# Patient Record
Sex: Female | Born: 1965 | Race: White | Hispanic: No | Marital: Married | Smoking: Never smoker
Health system: Southern US, Community
[De-identification: ages and names within clinical notes are randomized; demographics above are authoritative.]

## PROBLEM LIST (undated history)

## (undated) DIAGNOSIS — F419 Anxiety disorder, unspecified: Secondary | ICD-10-CM

## (undated) DIAGNOSIS — R253 Fasciculation: Secondary | ICD-10-CM

## (undated) DIAGNOSIS — K219 Gastro-esophageal reflux disease without esophagitis: Secondary | ICD-10-CM

## (undated) DIAGNOSIS — R202 Paresthesia of skin: Secondary | ICD-10-CM

## (undated) DIAGNOSIS — I10 Essential (primary) hypertension: Secondary | ICD-10-CM

## (undated) DIAGNOSIS — F32A Depression, unspecified: Secondary | ICD-10-CM

## (undated) DIAGNOSIS — F329 Major depressive disorder, single episode, unspecified: Secondary | ICD-10-CM

## (undated) DIAGNOSIS — D649 Anemia, unspecified: Secondary | ICD-10-CM

## (undated) DIAGNOSIS — G5600 Carpal tunnel syndrome, unspecified upper limb: Secondary | ICD-10-CM

## (undated) DIAGNOSIS — G510 Bell's palsy: Secondary | ICD-10-CM

## (undated) DIAGNOSIS — R790 Abnormal level of blood mineral: Secondary | ICD-10-CM

## (undated) DIAGNOSIS — R42 Dizziness and giddiness: Secondary | ICD-10-CM

## (undated) DIAGNOSIS — R0789 Other chest pain: Secondary | ICD-10-CM

## (undated) DIAGNOSIS — R002 Palpitations: Secondary | ICD-10-CM

## (undated) HISTORY — PX: EYE SURGERY: SHX253

## (undated) HISTORY — DX: Other chest pain: R07.89

## (undated) HISTORY — DX: Essential (primary) hypertension: I10

## (undated) HISTORY — DX: Fasciculation: R25.3

## (undated) HISTORY — DX: Palpitations: R00.2

## (undated) HISTORY — PX: EXTERNAL EAR SURGERY: SHX627

## (undated) HISTORY — DX: Dizziness and giddiness: R42

## (undated) HISTORY — PX: REFRACTIVE SURGERY: SHX103

## (undated) HISTORY — DX: Bell's palsy: G51.0

## (undated) HISTORY — DX: Paresthesia of skin: R20.2

---

## 1988-07-24 HISTORY — PX: WISDOM TOOTH EXTRACTION: SHX21

## 1998-07-24 DIAGNOSIS — G510 Bell's palsy: Secondary | ICD-10-CM

## 1998-07-24 HISTORY — DX: Bell's palsy: G51.0

## 1999-10-09 ENCOUNTER — Inpatient Hospital Stay (HOSPITAL_COMMUNITY): Admission: AD | Admit: 1999-10-09 | Discharge: 1999-10-12 | Payer: Self-pay | Admitting: Gynecology

## 1999-11-21 ENCOUNTER — Other Ambulatory Visit: Admission: RE | Admit: 1999-11-21 | Discharge: 1999-11-21 | Payer: Self-pay | Admitting: Gynecology

## 2001-04-04 ENCOUNTER — Other Ambulatory Visit: Admission: RE | Admit: 2001-04-04 | Discharge: 2001-04-04 | Payer: Self-pay | Admitting: Gynecology

## 2002-03-19 ENCOUNTER — Other Ambulatory Visit: Admission: RE | Admit: 2002-03-19 | Discharge: 2002-03-19 | Payer: Self-pay | Admitting: Gynecology

## 2002-04-08 ENCOUNTER — Encounter: Admission: RE | Admit: 2002-04-08 | Discharge: 2002-04-08 | Payer: Self-pay | Admitting: Gynecology

## 2002-04-08 ENCOUNTER — Encounter: Payer: Self-pay | Admitting: Gynecology

## 2004-01-12 ENCOUNTER — Other Ambulatory Visit: Admission: RE | Admit: 2004-01-12 | Discharge: 2004-01-12 | Payer: Self-pay | Admitting: Gynecology

## 2005-10-30 ENCOUNTER — Other Ambulatory Visit: Admission: RE | Admit: 2005-10-30 | Discharge: 2005-10-30 | Payer: Self-pay | Admitting: Gynecology

## 2006-03-24 HISTORY — PX: NOVASURE ABLATION: SHX5394

## 2006-04-19 ENCOUNTER — Encounter (INDEPENDENT_AMBULATORY_CARE_PROVIDER_SITE_OTHER): Payer: Self-pay | Admitting: Specialist

## 2006-04-19 ENCOUNTER — Ambulatory Visit (HOSPITAL_COMMUNITY): Admission: RE | Admit: 2006-04-19 | Discharge: 2006-04-19 | Payer: Self-pay | Admitting: Gynecology

## 2007-04-05 ENCOUNTER — Other Ambulatory Visit: Admission: RE | Admit: 2007-04-05 | Discharge: 2007-04-05 | Payer: Self-pay | Admitting: Gynecology

## 2007-04-05 ENCOUNTER — Encounter: Admission: RE | Admit: 2007-04-05 | Discharge: 2007-04-05 | Payer: Self-pay | Admitting: Gynecology

## 2008-04-10 ENCOUNTER — Encounter: Admission: RE | Admit: 2008-04-10 | Discharge: 2008-04-10 | Payer: Self-pay | Admitting: Gynecology

## 2008-04-17 ENCOUNTER — Ambulatory Visit: Payer: Self-pay | Admitting: Gynecology

## 2008-04-17 ENCOUNTER — Encounter: Payer: Self-pay | Admitting: Gynecology

## 2008-04-17 ENCOUNTER — Other Ambulatory Visit: Admission: RE | Admit: 2008-04-17 | Discharge: 2008-04-17 | Payer: Self-pay | Admitting: Gynecology

## 2008-09-16 ENCOUNTER — Ambulatory Visit: Payer: Self-pay | Admitting: Gynecology

## 2008-10-27 ENCOUNTER — Ambulatory Visit: Payer: Self-pay | Admitting: Gynecology

## 2009-05-14 ENCOUNTER — Encounter: Admission: RE | Admit: 2009-05-14 | Discharge: 2009-05-14 | Payer: Self-pay | Admitting: Gynecology

## 2009-05-20 ENCOUNTER — Encounter: Admission: RE | Admit: 2009-05-20 | Discharge: 2009-05-20 | Payer: Self-pay | Admitting: Gynecology

## 2009-07-20 ENCOUNTER — Other Ambulatory Visit: Admission: RE | Admit: 2009-07-20 | Discharge: 2009-07-20 | Payer: Self-pay | Admitting: Gynecology

## 2009-07-20 ENCOUNTER — Ambulatory Visit: Payer: Self-pay | Admitting: Gynecology

## 2009-07-24 HISTORY — PX: GUM SURGERY: SHX658

## 2010-07-20 ENCOUNTER — Encounter
Admission: RE | Admit: 2010-07-20 | Discharge: 2010-07-20 | Payer: Self-pay | Source: Home / Self Care | Attending: Gynecology | Admitting: Gynecology

## 2010-07-20 IMAGING — MG MM DIGITAL SCREENING
5 series · 5 of 5 positions shown · non-contrast
Comparison: none

DG SCREEN MAMMOGRAM BILATERAL
Bilateral CC and MLO view(s) were taken.

DIGITAL SCREENING MAMMOGRAM WITH CAD:
There are scattered fibroglandular densities.  No masses or malignant type calcifications are 
identified.  Compared with prior studies.
Images were processed with CAD.

[R CC (1 of 2)]
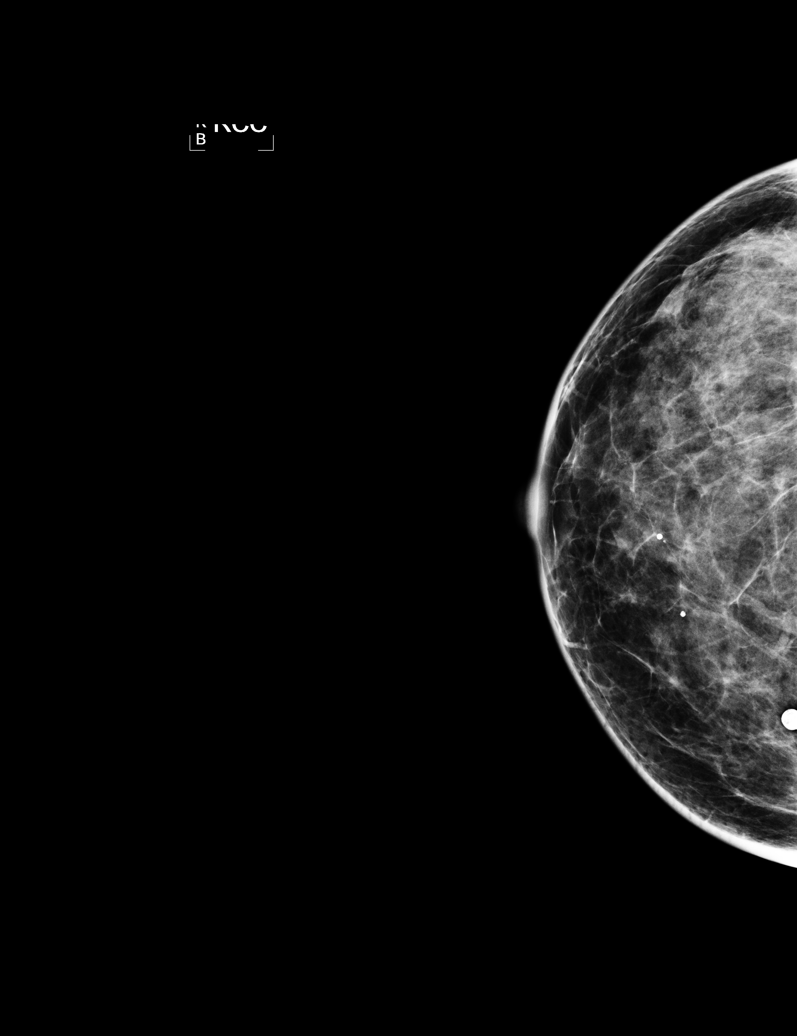

[L CC]
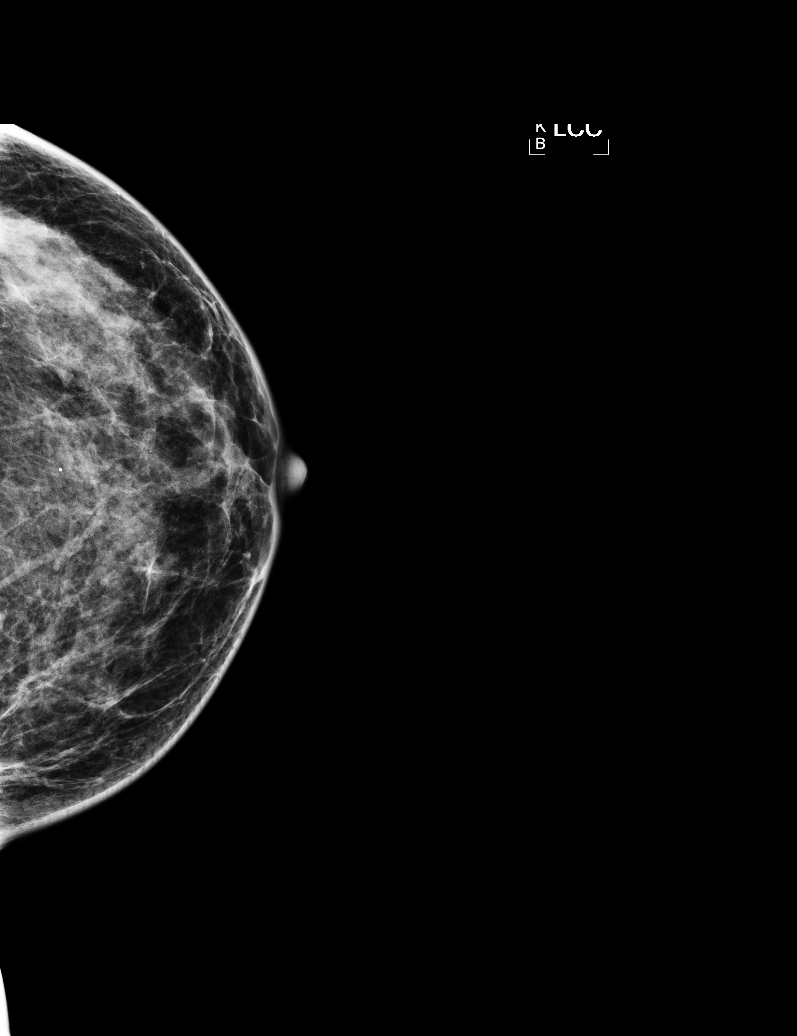

[L MLO]
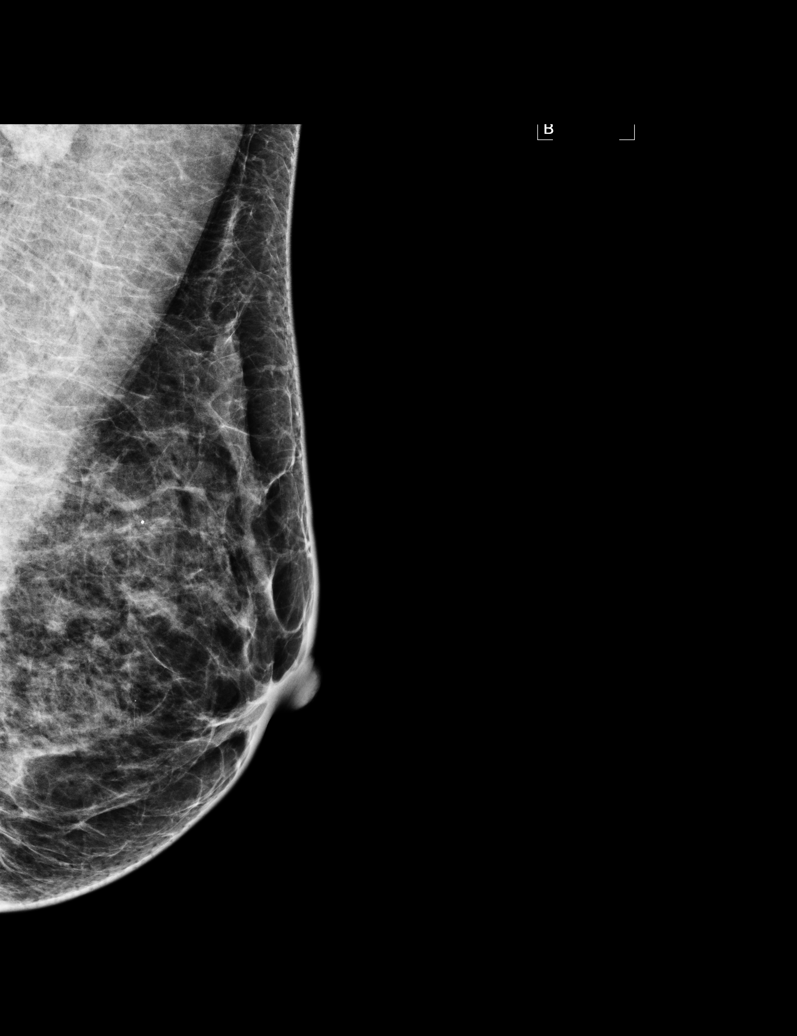

[R MLO]
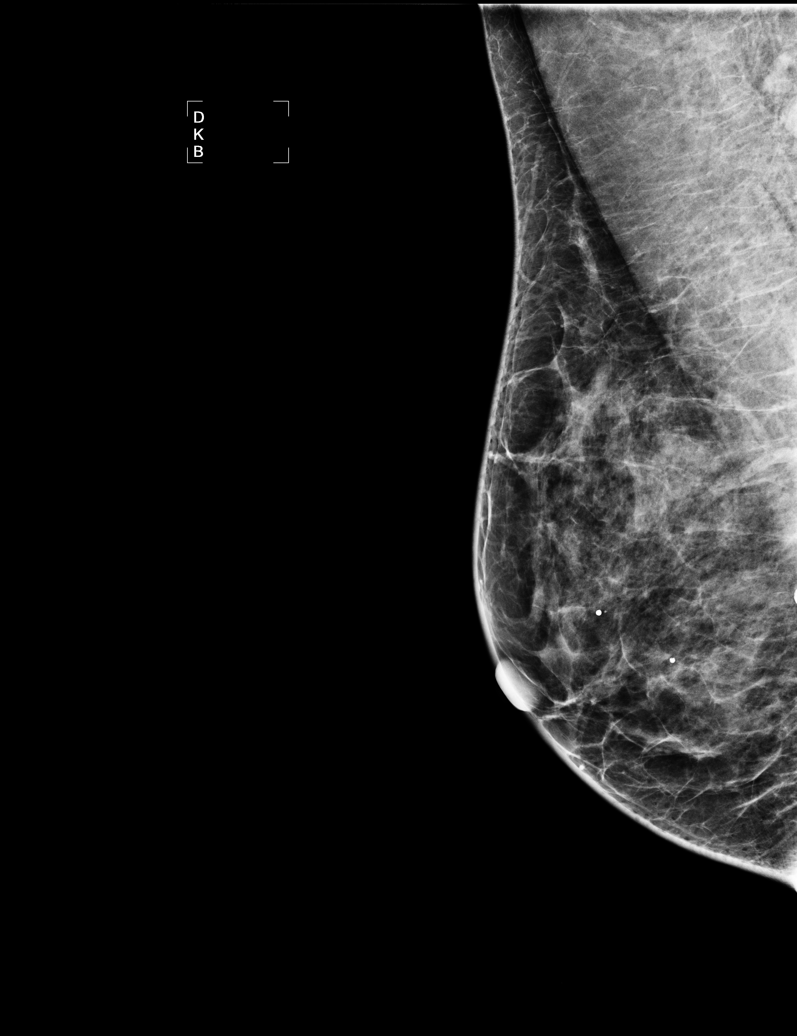

[R CC (2 of 2)]
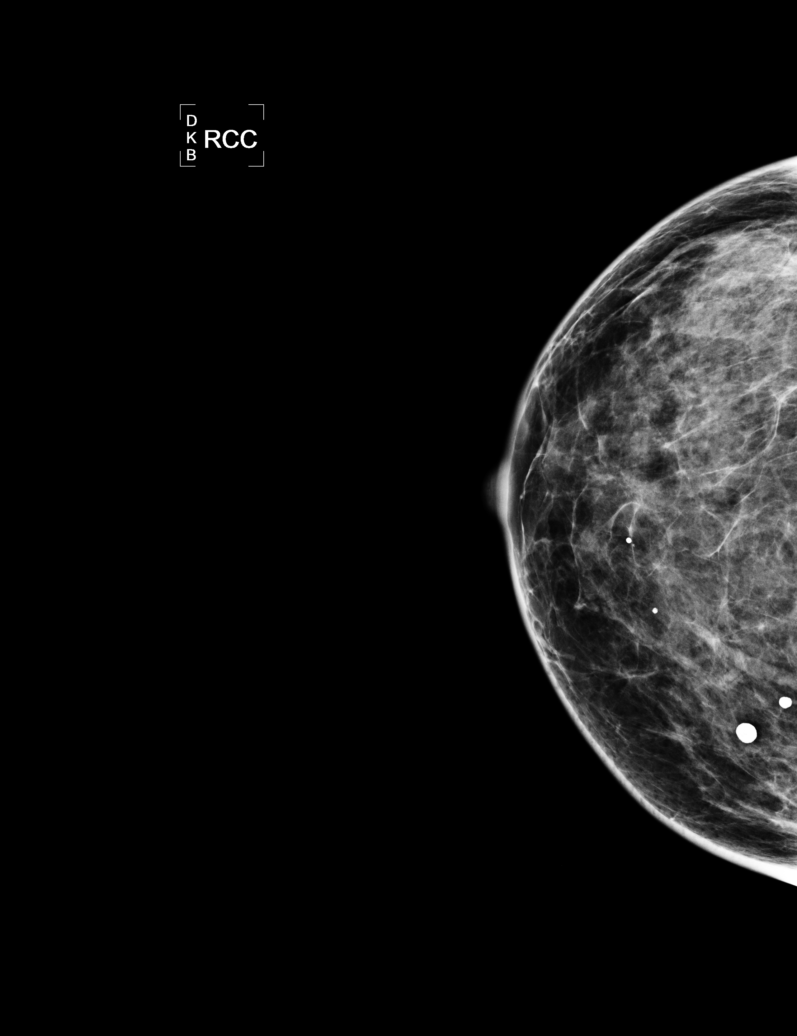

[5 of 5 positions shown; findings below may reference images not displayed]

IMPRESSION: No specific mammographic evidence of malignancy.  Next screening mammogram is recommended in one 
year.

A result letter of this screening mammogram will be mailed directly to the patient.

ASSESSMENT: Negative - BI-RADS 1

Screening mammogram in 1 year.
,

## 2010-07-24 HISTORY — PX: FOOT SURGERY: SHX648

## 2010-08-14 ENCOUNTER — Encounter: Payer: Self-pay | Admitting: Gynecology

## 2010-12-09 NOTE — H&P (Signed)
NAMEMINNA, DUMIRE                ACCOUNT NO.:  [id number]   MEDICAL RECORD NO.:  [id number]          PATIENT TYPE:  AMB   LOCATION:  SDC                           FACILITY:  [facility]   PHYSICIAN:  Timothy P. Fontaine, M.D.DATE OF BIRTH:  02/19/1966   DATE OF ADMISSION:  DATE OF DISCHARGE:                                HISTORY & PHYSICAL   CHIEF COMPLAINT:  Menorrhagia.   HISTORY OF PRESENT ILLNESS:  A 45 year old G2, P2 female vasectomy birth  control who presents complaining of increasing menorrhagia.  Her outpatient  evaluation includes a normal thyroid hormone, hemoglobin of 14 and a  sonohistogram which shows an anterior myoma that displaces the endometrial  cavity but is within the wall of the uterus.  There is no intracavitary  abnormalities.  The patient is admitted at this time for hysteroscopy, D&C,  NovaSure, endometrial ablation.  Past medical history is uncomplicated.  Past surgical history is uncomplicated.   CURRENT MEDICATIONS:  Multivitamins.   ALLERGIES:  No medications.   REVIEW OF SYSTEMS:  Noncontributory.   FAMILY HISTORY:  Noncontributory.   SOCIAL HISTORY:  Noncontributory.   ADMISSION PHYSICAL EXAM:  Afebrile.  Vital signs are stable.  HEENT:  Normal.  LUNGS:  Clear.  CARDIAC:  Regular rate.  No rubs, murmurs or gallops.  ABDOMINAL EXAM:  Benign.  PELVIC:  External BUS and vagina normal.  Cervix normal.  Uterus grossly  normal in size, midline mobile, nontender.  Adnexa without masses or  tenderness.   ASSESSMENT:  A 45 year old gravida 2, para 2 female vasectomy birth control  increasing menorrhagia becoming socially unacceptable for endometrial  ablation.  She does have small myomas.  I reviewed with her that she does  have some distortion of the cavity from the myoma, although it is minimal  and most of the myomas are intramural.  I reviewed the long-term/short-term  issues with ablation.  She understands that she should never achieve  pregnancy  following this procedure.  She is using vasectomy birth control  but if other situations present then she knows she needs to use absolute  contraception.  She also understands there is no guarantees as far as  bleeding relief that her menorrhagia may continue or her bleeding may change  and she understands that there is no guarantee that she will stop having  periods.  The acute intraoperative/postoperative risks were discussed.  I  reviewed the NovaSure technique and technology and the risks of bleeding  necessitating transfusion and the risks of transfusion were reviewed.  The  risks of infection, both acutely or the weeks following procedure, were  reviewed and the potential for prolonged antibiotics discussed.  The risk of  inadvertent injury to internal organs including bowel, bladder, ureters,  vessels and nerves either directly through uterine perforation or indirectly  through transuterine thermal damage necessitating major exploratory  reparative surgeries and future reparative surgeries including ostomy  formation was all reviewed, understood and accepted.  The patient  understands that it is machine dependent that if there is a malfunction in  the machine or her cavity is such  that will not allow proper placement of  the instrument that we may not be able to do the ablation at that time and  she understands and accepts this.  The patient's questions were answered to  her satisfaction.  She is ready to proceed with surgery.      Timothy P. Fontaine, M.D.  Electronically Signed     TPF/MEDQ  D:  04/12/2006  T:  04/14/2006  Job:  [id number]

## 2010-12-09 NOTE — Op Note (Signed)
NAMECHRISTEENA, Suzanne Marks                ACCOUNT NO.:  [id number]   MEDICAL RECORD NO.:  [id number]          PATIENT TYPE:  AMB   LOCATION:  SDC                           [facility]ILITY:  [facility]   PHYSICIAN:  Timothy P. Fontaine, M.D.DATE OF BIRTH:  1966-06-18   DATE OF PROCEDURE:  04/19/2006  DATE OF DISCHARGE:                                 OPERATIVE REPORT   PREOPERATIVE DIAGNOSES:  1. Menorrhagia.  2. Leiomyomata.   POSTOPERATIVE DIAGNOSES:  1. Menorrhagia.  2. Leiomyomata.   PROCEDURE:  NovaSure endometrial ablation, hysteroscopy, D&C.   SURGEON:  Timothy P. Fontaine, M.D.   ANESTHETIC:  General with 1% lidocaine paracervical block.   ESTIMATED BLOOD LOSS:  Minimal.  Sorbitol discrepancy minimal.   COMPLICATIONS:  None.   SPECIMEN:  Endometrial curetting.   FINDINGS:  EUA, external, BUS, vagina normal.  Cervix normal.  Bimanual  uterus globoid retroverted, upper limits of normal in size.  Adnexa without  masses.  Hysteroscopic, anterior submucous myoma, approximately half within  the cavity, otherwise hysteroscopy was normal and adequate.   PROCEDURE:  The patient was taken to the operating room, underwent general  anesthesia, was placed in low dorsal lithotomy position, received  perineal/vaginal preparation with Betadine scrub and solution.  Bladder  emptied with an in-and-out Foley catheterization and EUA performed.  The  patient was draped in the usual fashion.  Cervix was gently dilated, and the  cervical and the endometrial cavity lengths were determined.  Hysteroscopy  was performed.  Findings noted above.  Subsequently, the NovaSure  endometrial ablator was placed within the uterine cavity.  The instrument  deployed and manipulated to assure proper placement, and the cavity width  was determined.  The carbon dioxide test was then performed and passed.  Subsequently, the ablation was performed without difficulty.  Rehysteroscopy  showed good uterine distension, no evidence  of perforation, and uniform  treatment with the exception on the superior/posterior aspect of the myoma,  and fundus was untreated due to the myoma obscuring this area.  The  instruments were all removed.  Adequate hemostasis was visualized.  The  patient placed in supine  position, awakened without difficulty, and taken to recovery room in good  condition, having tolerated procedure well.  NovaSure measurements:  Sounding length 11 cm, cervical length 4 cm, cavity length 7 cm, cavity  width 3.3, power 118, time 54 seconds.      Timothy P. Fontaine, M.D.  Electronically Signed     TPF/MEDQ  D:  04/19/2006  T:  04/20/2006  Job:  [id number]

## 2010-12-09 NOTE — Discharge Summary (Signed)
[facility]  Patient:    Suzanne Marks, Suzanne Marks                       MRN: [id number] Adm. Date:  [id number] Disc. Date: [id number] Attending:  Douglass Rivers Dictator:   Antony Contras, R.N. [facility]                           Discharge Summary  DISCHARGE DIAGNOSIS:          Intrauterine pregnancy at term, with delivery of viable infant.  PROCEDURES:                   Normal spontaneous vaginal delivery over an intact perineum with repair of a superficial second degree laceration.  HISTORY OF PRESENT ILLNESS:   The patient is a 45 year old, gravida 2, para 1, 1-0-0-1, with an EDC of October 06, 1999.  Prenatal course uncomplicated.  The patient is Rh negative.  Prenatal labs are as follows:  Blood type O negative, antibody screen negative, RPR, HBsAg nonreactive.  Rubella titer positive.  GBS was negative.  HOSPITAL COURSE AND TREATMENT: The patient was admitted with questionable rupture membranes on October 10, 1999. At this time she was found to be 6 cm, 90%, and a -2 station.  She did progress to  complete dilatation and was delivered by Dr. Douglass Rivers of a viable female infant, Apgars 8/9, birth weight of 7 pounds 8 ounces, over an intact perineum with repair of a superficial second degree laceration.  Postpartum course was uncomplicated. She remained afebrile, was voiding well.  Postoperative CBC:  Hematocrit 36, hemoglobin 12.7, WBC 13.3, platelets 175.  She was able to be discharged on her  second postpartum day in satisfactory condition.  The baby was Rh negative, so he did not require RhoGAM.  DISPOSITION:                  Discharged to be followed up at [facility] in six weeks.  DISCHARGE MEDICATIONS:        Motrin and Tylox for pain.  Continue with prenatal vitamins. DD:  10/28/99 TD:  10/31/99 Job: [id number]

## 2011-01-17 ENCOUNTER — Other Ambulatory Visit: Payer: Self-pay | Admitting: Gynecology

## 2011-01-17 ENCOUNTER — Encounter (INDEPENDENT_AMBULATORY_CARE_PROVIDER_SITE_OTHER): Payer: BC Managed Care – PPO | Admitting: Gynecology

## 2011-01-17 ENCOUNTER — Other Ambulatory Visit (HOSPITAL_COMMUNITY)
Admission: RE | Admit: 2011-01-17 | Discharge: 2011-01-17 | Disposition: A | Discharge status: Home / Self Care | Payer: BC Managed Care – PPO | Source: Ambulatory Visit | Attending: Gynecology | Admitting: Gynecology

## 2011-01-17 DIAGNOSIS — Z01419 Encounter for gynecological examination (general) (routine) without abnormal findings: Secondary | ICD-10-CM

## 2011-01-17 DIAGNOSIS — B3731 Acute candidiasis of vulva and vagina: Secondary | ICD-10-CM

## 2011-01-17 DIAGNOSIS — Z13220 Encounter for screening for lipoid disorders: Secondary | ICD-10-CM

## 2011-01-17 DIAGNOSIS — Z833 Family history of diabetes mellitus: Secondary | ICD-10-CM

## 2011-01-17 DIAGNOSIS — Z124 Encounter for screening for malignant neoplasm of cervix: Secondary | ICD-10-CM | POA: Insufficient documentation

## 2011-01-17 DIAGNOSIS — B373 Candidiasis of vulva and vagina: Secondary | ICD-10-CM

## 2011-05-25 HISTORY — PX: REFRACTIVE SURGERY: SHX103

## 2011-06-21 ENCOUNTER — Other Ambulatory Visit: Payer: Self-pay | Admitting: Gynecology

## 2011-06-21 DIAGNOSIS — Z1231 Encounter for screening mammogram for malignant neoplasm of breast: Secondary | ICD-10-CM

## 2011-07-28 ENCOUNTER — Ambulatory Visit
Admission: RE | Admit: 2011-07-28 | Discharge: 2011-07-28 | Disposition: A | Discharge status: Home / Self Care | Payer: BC Managed Care – PPO | Source: Ambulatory Visit | Attending: Gynecology | Admitting: Gynecology

## 2011-07-28 DIAGNOSIS — Z1231 Encounter for screening mammogram for malignant neoplasm of breast: Secondary | ICD-10-CM

## 2012-01-23 ENCOUNTER — Ambulatory Visit (INDEPENDENT_AMBULATORY_CARE_PROVIDER_SITE_OTHER): Payer: BC Managed Care – PPO | Admitting: Gynecology

## 2012-01-23 ENCOUNTER — Encounter: Payer: Self-pay | Admitting: Gynecology

## 2012-01-23 VITALS — BP 112/70 | Ht 65.5 in | Wt 127.0 lb

## 2012-01-23 DIAGNOSIS — L659 Nonscarring hair loss, unspecified: Secondary | ICD-10-CM

## 2012-01-23 DIAGNOSIS — Z01419 Encounter for gynecological examination (general) (routine) without abnormal findings: Secondary | ICD-10-CM

## 2012-01-23 DIAGNOSIS — Z13220 Encounter for screening for lipoid disorders: Secondary | ICD-10-CM

## 2012-01-23 DIAGNOSIS — Z131 Encounter for screening for diabetes mellitus: Secondary | ICD-10-CM

## 2012-01-23 NOTE — Progress Notes (Signed)
Suzanne Marks [birth year] [id number]        46 y.o. G2 P2 for annual exam.  Doing well no complaints.  Past medical history,surgical history, medications, allergies, family history and social history were all reviewed and documented in the EPIC chart. ROS:  Was performed and pertinent positives and negatives are included in the history.  Exam: Amy assistant Filed Vitals:   01/23/12 1046  BP: 112/70   General appearance  Normal Skin grossly normal Head/Neck normal with no cervical or supraclavicular adenopathy thyroid normal Lungs  clear Cardiac RR, without RMG Abdominal  soft, nontender, without masses, organomegaly or hernia Breasts  examined lying and sitting without masses, retractions, discharge or axillary adenopathy. Pelvic  Ext/BUS/vagina  normal   Cervix  normal   Uterus  retroverted, normal size, shape and contour, midline and mobile nontender   Adnexa  Without masses or tenderness    Anus and perineum  normal   Rectovaginal  normal sphincter tone without palpated masses or tenderness.    Assessment/Plan:  46 y.o. G2 P2 female for annual exam, regular menses, vasectomy birth control.    1. Mammography. Patient had mammogram January 2013. We'll continue with annual mammography. SBE monthly reviewed. 2. NovaSure ablation. Menses are light, acceptable. We'll continue to monitor. 3. Pap smear. Last Pap smear 2012. No Pap smear done today. No history of abnormal Pap smears with numerous normal reports in her chart. Per current screening guidelines we'll plan every 3-5 your screening. 4. Alopecia. Patient has noticed generalized hair loss. Saw dermatologist who recommended iron, ferritin and TSH levels. These were ordered for her we'll forward to her dermatologist. 5. Health maintenance. Baseline CBC lipid profile glucose urinalysis ordered along with the above blood work. Assuming she continues well from a gynecologic standpoint and she will see me in a year, sooner as  needed.    Dara Lords MD, 11:04 AM 01/23/2012

## 2012-01-23 NOTE — Patient Instructions (Signed)
Office will call you with lab results. Follow up in one year for annual gynecologic exam. 

## 2012-01-24 ENCOUNTER — Other Ambulatory Visit: Payer: Self-pay | Admitting: *Deleted

## 2012-01-24 DIAGNOSIS — Z01419 Encounter for gynecological examination (general) (routine) without abnormal findings: Secondary | ICD-10-CM

## 2012-01-24 DIAGNOSIS — L659 Nonscarring hair loss, unspecified: Secondary | ICD-10-CM

## 2012-01-24 DIAGNOSIS — Z131 Encounter for screening for diabetes mellitus: Secondary | ICD-10-CM

## 2012-01-24 DIAGNOSIS — Z13220 Encounter for screening for lipoid disorders: Secondary | ICD-10-CM

## 2012-01-24 LAB — URINALYSIS W MICROSCOPIC + REFLEX CULTURE
Bacteria, UA: NONE SEEN
Casts: NONE SEEN
Glucose, UA: NEGATIVE mg/dL
Hgb urine dipstick: NEGATIVE
Ketones, ur: 15 mg/dL — AB
Leukocytes, UA: NEGATIVE
Protein, ur: NEGATIVE mg/dL
pH: 5.5 (ref 5.0–8.0)

## 2012-01-24 NOTE — Addendum Note (Signed)
Addended byValeda Malm L on: 01/24/2012 02:25 PM   Modules accepted: Orders

## 2012-01-30 ENCOUNTER — Other Ambulatory Visit: Payer: BC Managed Care – PPO

## 2012-01-30 DIAGNOSIS — Z131 Encounter for screening for diabetes mellitus: Secondary | ICD-10-CM

## 2012-01-30 DIAGNOSIS — Z13220 Encounter for screening for lipoid disorders: Secondary | ICD-10-CM

## 2012-01-30 DIAGNOSIS — L659 Nonscarring hair loss, unspecified: Secondary | ICD-10-CM

## 2012-01-30 DIAGNOSIS — Z01419 Encounter for gynecological examination (general) (routine) without abnormal findings: Secondary | ICD-10-CM

## 2012-01-30 LAB — CBC WITH DIFFERENTIAL/PLATELET
Basophils Absolute: 0 10*3/uL (ref 0.0–0.1)
Basophils Relative: 0 % (ref 0–1)
Eosinophils Absolute: 0.2 10*3/uL (ref 0.0–0.7)
Eosinophils Relative: 3 % (ref 0–5)
HCT: 39.1 % (ref 36.0–46.0)
Hemoglobin: 13.5 g/dL (ref 12.0–15.0)
Lymphocytes Relative: 29 % (ref 12–46)
Lymphs Abs: 1.6 10*3/uL (ref 0.7–4.0)
MCH: 31.9 pg (ref 26.0–34.0)
MCHC: 34.5 g/dL (ref 30.0–36.0)
MCV: 92.4 fL (ref 78.0–100.0)
Monocytes Absolute: 0.5 10*3/uL (ref 0.1–1.0)
Monocytes Relative: 9 % (ref 3–12)
Neutro Abs: 3.1 10*3/uL (ref 1.7–7.7)
Neutrophils Relative %: 59 % (ref 43–77)
Platelets: 283 10*3/uL (ref 150–400)
RBC: 4.23 MIL/uL (ref 3.87–5.11)
RDW: 13 % (ref 11.5–15.5)
WBC: 5.3 10*3/uL (ref 4.0–10.5)

## 2012-01-31 LAB — TSH: TSH: 2.598 u[IU]/mL (ref 0.350–4.500)

## 2012-01-31 LAB — FERRITIN: Ferritin: 15 ng/mL (ref 10–291)

## 2012-01-31 LAB — LIPID PANEL
Cholesterol: 179 mg/dL (ref 0–200)
HDL: 69 mg/dL (ref >39)
LDL Cholesterol: 97 mg/dL (ref 0–99)
Triglycerides: 63 mg/dL (ref <150)

## 2012-06-28 ENCOUNTER — Ambulatory Visit (INDEPENDENT_AMBULATORY_CARE_PROVIDER_SITE_OTHER): Payer: BC Managed Care – PPO | Admitting: Gynecology

## 2012-06-28 ENCOUNTER — Encounter: Payer: Self-pay | Admitting: Gynecology

## 2012-06-28 DIAGNOSIS — N9489 Other specified conditions associated with female genital organs and menstrual cycle: Secondary | ICD-10-CM

## 2012-06-28 DIAGNOSIS — B373 Candidiasis of vulva and vagina: Secondary | ICD-10-CM

## 2012-06-28 DIAGNOSIS — L659 Nonscarring hair loss, unspecified: Secondary | ICD-10-CM

## 2012-06-28 DIAGNOSIS — K644 Residual hemorrhoidal skin tags: Secondary | ICD-10-CM

## 2012-06-28 DIAGNOSIS — N949 Unspecified condition associated with female genital organs and menstrual cycle: Secondary | ICD-10-CM

## 2012-06-28 LAB — WET PREP FOR TRICH, YEAST, CLUE: Trich, Wet Prep: NONE SEEN

## 2012-06-28 MED ORDER — FLUCONAZOLE 200 MG PO TABS: 200.0000 mg | ORAL_TABLET | Freq: Every day | ORAL | Status: DC

## 2012-06-28 NOTE — Progress Notes (Signed)
Patient presents with several days of vaginal itching as well as some itching around her rectum. Asked if she could have some blood drawn in reference to her alopecia as she is seeing a dermatologist and they had recommended a ferritin level.  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 Cervix normal. Uterus retroverted. Adnexa without masses or tenderness. Perineum/anus with small hemorrhoidal skin tag at 12:00 not inflamed. No evidence of fissures  Assessment and plan: 1. Vaginal itching. Wet prep is positive for yeast. Given the perirectal involvement Will treat with Diflucan 200 daily x3 days. I gave her 2 extra pills to have available for when necessary use. Follow up if symptoms persist or recur. 2. Generalized alopecia. We've discussed this previously I ordered a ferritin level testosterone and TSH. She'll have these to provide to her dermatologist. 3. Hemorrhoidal skin tag. Discussed with patient and reassured her that this is not unusual and probably a remnant from a pregnancy hemorrhoid.

## 2012-06-28 NOTE — Patient Instructions (Signed)
Take Diflucan pill one daily for 3 days. Save the other 2 pills to use as needed for itching.

## 2012-07-02 ENCOUNTER — Other Ambulatory Visit: Payer: Self-pay | Admitting: Gynecology

## 2012-07-02 DIAGNOSIS — Z1231 Encounter for screening mammogram for malignant neoplasm of breast: Secondary | ICD-10-CM

## 2012-07-08 ENCOUNTER — Telehealth: Payer: Self-pay | Admitting: Gynecology

## 2012-07-08 MED ORDER — TERCONAZOLE 0.8 % VA CREA: 1.0000 | TOPICAL_CREAM | Freq: Every day | VAGINAL | Status: DC

## 2012-07-08 NOTE — Telephone Encounter (Signed)
Was in Dec 6 and she said you diagnosed yeast infection. She has taken the 3 pills and still pretty uncomfortable. She said you wrote for two extra pills but since they haven't really helped much she didn't know if she should fill those and take or if you might want to prescribe her different Rx.

## 2012-07-08 NOTE — Telephone Encounter (Signed)
Patient informed. 

## 2012-07-08 NOTE — Telephone Encounter (Signed)
Recommend Terazol 3 day cream. It may be resistant to Diflucan and Terazol should take care of it.

## 2012-07-17 ENCOUNTER — Ambulatory Visit: Payer: BC Managed Care – PPO

## 2012-07-17 ENCOUNTER — Ambulatory Visit (INDEPENDENT_AMBULATORY_CARE_PROVIDER_SITE_OTHER): Payer: BC Managed Care – PPO | Admitting: Family Medicine

## 2012-07-17 VITALS — BP 161/100 | HR 80 | Temp 98.7°F | Resp 16 | Ht 65.75 in | Wt 131.2 lb

## 2012-07-17 DIAGNOSIS — R0781 Pleurodynia: Secondary | ICD-10-CM

## 2012-07-17 DIAGNOSIS — R079 Chest pain, unspecified: Secondary | ICD-10-CM

## 2012-07-17 DIAGNOSIS — R000 Tachycardia, unspecified: Secondary | ICD-10-CM

## 2012-07-17 DIAGNOSIS — F439 Reaction to severe stress, unspecified: Secondary | ICD-10-CM

## 2012-07-17 DIAGNOSIS — R0789 Other chest pain: Secondary | ICD-10-CM

## 2012-07-17 DIAGNOSIS — Z733 Stress, not elsewhere classified: Secondary | ICD-10-CM

## 2012-07-17 LAB — POCT CBC
Granulocyte percent: 73.6 % (ref 37–80)
HCT, POC: 44.8 % (ref 37.7–47.9)
Hemoglobin: 14.5 g/dL (ref 12.2–16.2)
Lymph, poc: 1.6 (ref 0.6–3.4)
MCH, POC: 30.7 pg (ref 27–31.2)
MCHC: 32.4 g/dL (ref 31.8–35.4)
MCV: 95 fL (ref 80–97)
MID (cbc): 0.5 (ref 0–0.9)
MPV: 8.6 fL (ref 0–99.8)
POC Granulocyte: 6 (ref 2–6.9)
POC LYMPH PERCENT: 20.1 % (ref 10–50)
POC MID %: 6.3 %M (ref 0–12)
Platelet Count, POC: 379 10*3/uL (ref 142–424)
RBC: 4.72 M/uL (ref 4.04–5.48)
RDW, POC: 12.5 %
WBC: 8.2 10*3/uL (ref 4.6–10.2)

## 2012-07-17 LAB — COMPREHENSIVE METABOLIC PANEL
Alkaline Phosphatase: 48 U/L (ref 39–117)
BUN: 10 mg/dL (ref 6–23)
Glucose, Bld: 99 mg/dL (ref 70–99)
Sodium: 136 mEq/L (ref 135–145)
Total Bilirubin: 0.5 mg/dL (ref 0.3–1.2)

## 2012-07-17 LAB — COMPREHENSIVE METABOLIC PANEL WITH GFR
ALT: 12 U/L (ref 0–35)
AST: 22 U/L (ref 0–37)
Albumin: 5 g/dL (ref 3.5–5.2)
CO2: 28 meq/L (ref 19–32)
Calcium: 9.9 mg/dL (ref 8.4–10.5)
Chloride: 100 meq/L (ref 96–112)
Creat: 0.75 mg/dL (ref 0.50–1.10)
Potassium: 4.2 meq/L (ref 3.5–5.3)
Total Protein: 7.9 g/dL (ref 6.0–8.3)

## 2012-07-17 LAB — POCT GLYCOSYLATED HEMOGLOBIN (HGB A1C): Hemoglobin A1C: 5.1

## 2012-07-17 LAB — TSH: TSH: 2.581 u[IU]/mL (ref 0.350–4.500)

## 2012-07-17 MED ORDER — PROPRANOLOL HCL 10 MG PO TABS: 10.0000 mg | ORAL_TABLET | Freq: Every day | ORAL | Status: DC

## 2012-07-17 NOTE — Progress Notes (Addendum)
[facility]:  Office Visit  Chief Complaint:  Chief Complaint  Patient presents with  . Hypertension    BP has been high atleast since MOnday and was woke up in the early hours this am with tachycardia. and felt it last night at church    HPI: Suzanne Marks is a 46 y.o. female who complains of palpitations. She is here today because felt her heart rate was fast last night , did not have CP, SOB, diaphoresis, nausea, vomiting, abd pain, numbness, weakness with this. Went to PCP last week and was told her BP was elevated SBP 148 and she has never had BP problems before. She has no risk factors for heart disease, denies HTN, DM, XOL, family h.o premature MIs. She is active. She does not smoke. She has had some left sided rib pressure and was xrayed by her PCP but sinc ethey are with [facility] I am not able to see the xray results. She denies excessive caffeine use or alcohol use. Not on any meds except multivitamin and is considering taking an iron supplement because ferritin was 11, however no sxs of fatigue or anemia based on prior labs. She was told to do this for dryness in her hair by one of her doctors. No prior h.o chest pain/palpitations. She is under a lot of stress because her mom who has a known Glioblastoma Multiforme tumor s/p surgery now has a recurrence and she visited her for the holidays and her mom "was not the same". Has had increase thirst.   History reviewed. No pertinent past medical history. Past Surgical History  Procedure Date  . Novasure ablation 03/2006  . External ear surgery     age 12  . Gum surgery 2011  . Foot surgery 2012    left  . Wisdom tooth extraction 1990   History   Social History  . Marital Status: Married    Spouse Name: N/A    Number of Children: N/A  . Years of Education: N/A   Social History Main Topics  . Smoking status: Never Smoker   . Smokeless tobacco: Never Used  . Alcohol Use: No  . Drug Use: No  . Sexually Active:  Yes    Birth Control/ Protection: Other-see comments, Surgical     Comment: vasectomy   Other Topics Concern  . None   Social History Narrative  . None   Family History  Problem Relation Age of Onset  . Hypertension Father   . Cancer Mother    Allergies  Allergen Reactions  . Lanolin     topical   Prior to Admission medications   Medication Sig Start Date End Date Taking? Authorizing Provider  Calcium Carbonate-Vitamin D (CALCIUM + D PO) Take by mouth.   Yes Historical Provider, MD  fish oil-omega-3 fatty acids 1000 MG capsule Take 2 g by mouth daily.   Yes Historical Provider, MD  Multiple Vitamin (MULTIVITAMIN) tablet Take 1 tablet by mouth daily.   Yes Historical Provider, MD  fluconazole (DIFLUCAN) 200 MG tablet Take 1 tablet (200 mg total) by mouth daily. 06/28/12   Anastasio Auerbach, MD  terconazole (TERAZOL 3) 0.8 % vaginal cream Place 1 applicator vaginally at bedtime. For 3 nights 07/08/12   Anastasio Auerbach, MD     ROS: The patient denies fevers, chills, night sweats, unintentional weight loss, chest pain,  wheezing, dyspnea on exertion, nausea, vomiting, abdominal pain, dysuria, hematuria, melena, numbness, weakness, or tingling.  All other systems have been reviewed and were otherwise negative with the exception of those mentioned in the HPI and as above.    PHYSICAL EXAM: Filed Vitals:   07/17/12 1218  BP: 161/100  Pulse: 80  Temp: 98.7 F (37.1 C)  Resp: 16   Filed Vitals:   07/17/12 1218  Height: 5' 5.75" (1.67 m)  Weight: 131 lb 3.2 oz (59.512 kg)   Body mass index is 21.34 kg/(m^2).  General: Alert, she appears to not be in acute distress HEENT:  Normocephalic, atraumatic, oropharynx patent. EOMI Cardiovascular:  Regular rate and rhythm, no rubs murmurs or gallops.  No Carotid bruits, radial pulse intact. No pedal edema.  Respiratory: Clear to auscultation bilaterally.  No wheezes, rales, or rhonchi.  No cyanosis, no use of accessory  musculature GI: No organomegaly, abdomen is soft and non-tender, positive bowel sounds.  No masses. Skin: No rashes. Neurologic: Facial musculature symmetric. Psychiatric: Patient is appropriate throughout our interaction. Lymphatic: No cervical lymphadenopathy Musculoskeletal: Gait intact.   LABS: Results for orders placed in visit on 07/17/12  POCT CBC      Component Value Range   WBC 8.2  4.6 - 10.2 K/uL   Lymph, poc 1.6  0.6 - 3.4   POC LYMPH PERCENT 20.1  10 - 50 %L   MID (cbc) 0.5  0 - 0.9   POC MID % 6.3  0 - 12 %M   POC Granulocyte 6.0  2 - 6.9   Granulocyte percent 73.6  37 - 80 %G   RBC 4.72  4.04 - 5.48 M/uL   Hemoglobin 14.5  12.2 - 16.2 g/dL   HCT, POC 44.8  37.7 - 47.9 %   MCV 95.0  80 - 97 fL   MCH, POC 30.7  27 - 31.2 pg   MCHC 32.4  31.8 - 35.4 g/dL   RDW, POC 12.5     Platelet Count, POC 379  142 - 424 K/uL   MPV 8.6  0 - 99.8 fL  POCT GLYCOSYLATED HEMOGLOBIN (HGB A1C)      Component Value Range   Hemoglobin A1C 5.1    TSH      Component Value Range   TSH 2.581  0.350 - 4.500 uIU/mL  COMPREHENSIVE METABOLIC PANEL      Component Value Range   Sodium 136  135 - 145 mEq/L   Potassium 4.2  3.5 - 5.3 mEq/L   Chloride 100  96 - 112 mEq/L   CO2 28  19 - 32 mEq/L   Glucose, Bld 99  70 - 99 mg/dL   BUN 10  6 - 23 mg/dL   Creat 0.75  0.50 - 1.10 mg/dL   Total Bilirubin 0.5  0.3 - 1.2 mg/dL   Alkaline Phosphatase 48  39 - 117 U/L   AST 22  0 - 37 U/L   ALT 12  0 - 35 U/L   Total Protein 7.9  6.0 - 8.3 g/dL   Albumin 5.0  3.5 - 5.2 g/dL   Calcium 9.9  8.4 - 10.5 mg/dL     EKG/XRAY:   Primary read interpreted by Dr. Marin Comment at [facility]. EKG NSR 64 BPM, no ST T wave depression/elevation CXR-no acute cardiopulmonary process   ASSESSMENT/PLAN: Encounter Diagnoses  Name Primary?  . Tachycardia Yes  . Rib pain on left side   . Stress    Will fax to Dr. Myer Peer PN and labs once I receive them -[facility] Physician I discussed  with Magda Paganini that her BP  and HR may tolerat a low dose betablocker such as propranolol. On repeat BP reading her BP went from 160/100 , HR of 80 to 140/80, HR 75. Additionally the benefits of the BB is that it may reduce some of anxiety and we can use it for performance anxiety/stress. She will wait and see if shewants to do this after d.w her PCP and also she is going to [place] with her family next week and wopuld not like to have any problems on vacation. Rx Propranolol 10 mg PO daily  ( Pt will discuss with her PCP if she should take this, SEs d/w patient) Advise to get BP cuff and monitor HR and BP when she feels palpitations Offered to refer her to cardiologist if she would like but since everything look normal will defer for now.  F/u prn, Go to Er if sxs worsening.     Penney Domanski, [place], DO 07/18/2012 8:30 AM

## 2012-07-18 ENCOUNTER — Telehealth: Payer: Self-pay

## 2012-07-18 ENCOUNTER — Encounter: Payer: Self-pay | Admitting: Family Medicine

## 2012-07-18 ENCOUNTER — Telehealth: Payer: Self-pay | Admitting: Family Medicine

## 2012-07-18 NOTE — Telephone Encounter (Signed)
No problem, Faxed through epic and EKG was faxed separately.

## 2012-07-18 NOTE — Telephone Encounter (Signed)
Spoke to patient about labs.

## 2012-07-18 NOTE — Telephone Encounter (Signed)
Message copied by Johnnette Litter on Thu Jul 18, 2012 12:58 PM ------      Message from: Hamilton Capri P      Created: Thu Jul 18, 2012  8:42 AM       Can you fax  My Progress notes, labs, chest xray and ekg for patietn to her PCP. Dr Paulino Rily at [facility].             Thanks.       Dr. Conley Rolls

## 2012-08-12 ENCOUNTER — Ambulatory Visit: Payer: BC Managed Care – PPO

## 2012-08-13 ENCOUNTER — Ambulatory Visit
Admission: RE | Admit: 2012-08-13 | Discharge: 2012-08-13 | Disposition: A | Discharge status: Home / Self Care | Payer: BC Managed Care – PPO | Source: Ambulatory Visit | Attending: Gynecology | Admitting: Gynecology

## 2012-08-13 DIAGNOSIS — Z1231 Encounter for screening mammogram for malignant neoplasm of breast: Secondary | ICD-10-CM

## 2012-09-07 ENCOUNTER — Other Ambulatory Visit: Payer: Self-pay

## 2013-02-05 ENCOUNTER — Ambulatory Visit (INDEPENDENT_AMBULATORY_CARE_PROVIDER_SITE_OTHER): Payer: BC Managed Care – PPO | Admitting: Diagnostic Neuroimaging

## 2013-02-05 ENCOUNTER — Ambulatory Visit: Payer: BC Managed Care – PPO | Admitting: Diagnostic Neuroimaging

## 2013-02-05 ENCOUNTER — Encounter: Payer: Self-pay | Admitting: Diagnostic Neuroimaging

## 2013-02-05 VITALS — BP 129/79 | HR 79 | Temp 98.9°F | Ht 65.5 in | Wt 124.0 lb

## 2013-02-05 DIAGNOSIS — G5600 Carpal tunnel syndrome, unspecified upper limb: Secondary | ICD-10-CM | POA: Insufficient documentation

## 2013-02-05 DIAGNOSIS — F411 Generalized anxiety disorder: Secondary | ICD-10-CM

## 2013-02-05 DIAGNOSIS — R253 Fasciculation: Secondary | ICD-10-CM

## 2013-02-05 DIAGNOSIS — F419 Anxiety disorder, unspecified: Secondary | ICD-10-CM

## 2013-02-05 DIAGNOSIS — R259 Unspecified abnormal involuntary movements: Secondary | ICD-10-CM

## 2013-02-05 NOTE — Patient Instructions (Addendum)
It is likely your symptoms are coming from anxiety.   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 Use splints as needed and supports for keyboard and computer mouse.  If your symptoms worsen greatly in the next few months, return for follow up and we can run more tests.  Carpal Tunnel Syndrome The carpal tunnel is a narrow area located on the palm side of your wrist. The tunnel is formed by the wrist bones and ligaments. Nerves, blood vessels, and tendons pass through the carpal tunnel. Repeated wrist motion or certain diseases may cause swelling within the tunnel. This swelling pinches the main nerve in the wrist (median nerve) and causes the painful hand and arm condition called carpal tunnel syndrome. CAUSES   Repeated wrist motions.  Wrist injuries.  Certain diseases like arthritis, diabetes, alcoholism, hyperthyroidism, and kidney failure.  Obesity.  Pregnancy. SYMPTOMS   A "pins and needles" feeling in your fingers or hand.  Tingling or numbness in your fingers or hand.  An aching feeling in your entire arm.  Wrist pain that goes up your arm to your shoulder.  Pain that goes down into your palm or fingers.  A weak feeling in your hands. DIAGNOSIS  Your caregiver will take your history and perform a physical exam. An electromyography test may be needed. This test measures electrical signals sent out by the muscles. The electrical signals are usually slowed by carpal tunnel syndrome. You may also need X-rays. TREATMENT  Carpal tunnel syndrome may clear up by itself. Your caregiver may recommend a wrist splint or medicine such as a nonsteroidal anti-inflammatory medicine. Cortisone injections may help. Sometimes, surgery may be needed to free the pinched nerve.  HOME CARE INSTRUCTIONS   Take all medicine as directed by your caregiver. Only take over-the-counter or prescription medicines for pain, discomfort, or fever as directed by your  caregiver.  If you were given a splint to keep your wrist from bending, wear it as directed. It is important to wear the splint at night. Wear the splint for as long as you have pain or numbness in your hand, arm, or wrist. This may take 1 to 2 months.  Rest your wrist from any activity that may be causing your pain. If your symptoms are work-related, you may need to talk to your employer about changing to a job that does not require using your wrist.  Put ice on your wrist after long periods of wrist activity.  Put ice in a plastic bag.  Place a towel between your skin and the bag.  Leave the ice on for 15-20 minutes, 3-4 times a day.  Keep all follow-up visits as directed by your caregiver. This includes any orthopedic referrals, physical therapy, and rehabilitation. Any delay in getting necessary care could result in a delay or failure of your condition to heal. SEEK IMMEDIATE MEDICAL CARE IF:   You have new, unexplained symptoms.  Your symptoms get worse and are not helped or controlled with medicines. MAKE SURE YOU:   Understand these instructions.  Will watch your condition.  Will get help right away if you are not doing well or get worse. Document Released: 07/07/2000 Document Revised: 10/02/2011 Document Reviewed: 05/26/2011 [facility] Patient Information 2014 [place], [place].

## 2013-02-05 NOTE — Progress Notes (Signed)
[facility]  PATIENT: Suzanne Marks DOB: 1966-01-01   REFERRING PHYSICIAN: Mila Palmer, MD HISTORY FROM: patient REASON FOR VISIT: new consult   HISTORICAL  CHIEF COMPLAINT:  Chief Complaint  Patient presents with  . Neurologic Problem    muscle twitching, eye twitching    HISTORY OF PRESENT ILLNESS: Suzanne Marks is a 47 year old right-handed Caucasian female who comes in for consultation for bilateral arm paresthesias left greater than right and muscle twitching.  Patient reports that earlier in the year she was driving home from [facility] and noticed numbness and tingling in the anterior aspects of her bilateral forearms and some numbness in the left hand while gripping the steering wheel. Symptoms were intermittent and occurred during brief intervals over the next few weeks.  She was referred for nerve conduction study and EMG which was completed on 09/24/2012 and showed bilateral ulnar sensory and motor responses were normal. Bilateral median sensory response showed mildly prolonged peak latency. Bilateral median motor responses showed mildly prolonged distal latency consistent with mild carpal tunnel syndrome.  Afterwards patient tried wearing wrist splints during the night which were uncomfortable and she started using a keyboard pad and mouse pad. Symptoms disappeared within one week after adding these items and have not recurred.  Patient has noticed that she has increased muscle twitching occurring in bilateral legs and around right eye which started occurring after she found out her mother's brain tumor had returned and she had less than 2 months to live. Patient realizes that she has greatly increased anxiety since finding out about her mother's condition.  Twitching is intermittent and last only a few seconds mostly in her legs.  She knows of nothing that triggers the symptoms or necessarily relieves his symptoms.  Her mother has passed about 2  months ago.  She has also been experiencing palpitations which she has seen her primary care provider for and thought to be having panic attacks and was prescribed prn Xanax.  She doesn't like to take this medication and less absolutely needed. She states she went for massage and was told that her muscles were extremely tight all over.  She does exercise 1-1-1/2 hours per day to try to help relieve stress and does not consume caffeine on a daily basis.  Of note when she went on a week's vacation to the beach, she did not have any of the mentioned symptoms.     REVIEW OF SYSTEMS: Full 14 system review of systems performed and notable only for: Constitutional: N/A Cardiovascular: N/A  Ear/Nose/Throat: N/A  Skin: N/A  Eyes: Blurred vision (dry eyes)  Respiratory: N/A  Gastroitestinal: N/A  Hematology/Lymphatic: N/A  Endocrine: N/A Musculoskeletal:N/A  Allergy/Immunology: N/A  Neurological: N/A Psychiatric: anxiety Sleep: insomnia   ALLERGIES: Allergies  Allergen Reactions  . Lanolin     topical    HOME MEDICATIONS: Outpatient Prescriptions Prior to Visit  Medication Sig Dispense Refill  . fish oil-omega-3 fatty acids 1000 MG capsule Take 2 g by mouth daily.      . Multiple Vitamin (MULTIVITAMIN) tablet Take 1 tablet by mouth daily.       Marland Kitchen ALPRAZolam (XANAX) 0.25 MG tablet    Sig: Take 1 tablet by mouth as needed.     PAST MEDICAL HISTORY: Past Medical History  Diagnosis Date  . Bell's palsy 2000    PAST SURGICAL HISTORY: Past Surgical History  Procedure Laterality Date  . Novasure ablation  03/2006  . External ear surgery  age 60  . Gum surgery  2011  . Foot surgery  2012    left  . Wisdom tooth extraction  1990  . Refractive surgery      FAMILY HISTORY: Family History  Problem Relation Age of Onset  . Hypertension Father   . Cancer Mother   . Seizures Mother   . Heart Problems Maternal Grandmother   . Parkinsonism Maternal Grandmother   . Depression  Maternal Grandmother   . Heart Problems Maternal Grandfather   . Heart Problems Paternal Grandmother   . Heart Problems Paternal Grandfather     SOCIAL HISTORY: History   Social History  . Marital Status: Married    Spouse Name: Channing Mutters    Number of Children: 2  . Years of Education: College   Occupational History  .  [employer]   Social History Main Topics  . Smoking status: Never Smoker   . Smokeless tobacco: Never Used  . Alcohol Use: No  . Drug Use: No  . Sexually Active: Yes    Birth Control/ Protection: Other-see comments, Surgical     Comment: vasectomy   Other Topics Concern  . Not on file   Social History Narrative   Patient lives at home with her family.   Caffeine Use: occasionally    PHYSICAL EXAM  Filed Vitals:   02/05/13 0915  BP: 129/79  Pulse: 79  Temp: 98.9 F (37.2 C)  TempSrc: Oral  Height: 5' 5.5" (1.664 m)  Weight: 124 lb (56.246 kg)   Body mass index is 20.31 kg/(m^2).  Generalized: In no acute distress, pleasant Caucasian female well developed, well-groomed  Neck: Supple, no carotid bruits   Cardiac: Regular rate rhythm, no murmur   Pulmonary: Clear to auscultation bilaterally   Musculoskeletal: No deformity   Neurological examination   Mentation: Alert oriented to time, place, history taking, language fluent, and causual conversation  Cranial nerve II-XII: Pupils were equal round reactive to light extraocular movements were full, visual field were full on confrontational test. facial sensation and strength were normal. hearing was intact to finger rubbing bilaterally. Uvula tongue midline. head turning and shoulder shrug and were normal and symmetric.Tongue protrusion into cheek strength was normal. MOTOR: normal bulk and tone, full strength in the BUE, BLE, fine finger movements normal, no pronator drift; RARE MUSCLE TWITCHES NOTED IN LEFT GASTROCNEMIUS.  SENSORY: normal and symmetric to light touch, pinprick,  temperature, vibration and proprioception COORDINATION: finger-nose-finger, heel-to-shin bilaterally, there was no truncal ataxia REFLEXES: All deep tendon reflexes are symmetric and brisk 3+.  plantar responses were flexor bilaterally. GAIT/STATION: Rising up from seated position without assistance, normal stance, without trunk ataxia, moderate stride, good arm swing, smooth turning, able to perform tiptoe, and heel walking without difficulty.   DIAGNOSTIC DATA (LABS, IMAGING, TESTING) - I reviewed patient records, labs, notes, testing and imaging myself where available.  Lab Results  Component Value Date   WBC 8.2 07/17/2012   HGB 14.5 07/17/2012   HCT 44.8 07/17/2012   MCV 95.0 07/17/2012   PLT 283 01/30/2012      Component Value Date/Time   NA 136 07/17/2012 1253   K 4.2 07/17/2012 1253   CL 100 07/17/2012 1253   CO2 28 07/17/2012 1253   GLUCOSE 99 07/17/2012 1253   BUN 10 07/17/2012 1253   CREATININE 0.75 07/17/2012 1253   CALCIUM 9.9 07/17/2012 1253   PROT 7.9 07/17/2012 1253   ALBUMIN 5.0 07/17/2012 1253   AST 22 07/17/2012 1253  ALT 12 07/17/2012 1253   ALKPHOS 48 07/17/2012 1253   BILITOT 0.5 07/17/2012 1253   Lab Results  Component Value Date   CHOL 179 01/30/2012   HDL 69 01/30/2012   LDLCALC 97 01/30/2012   TRIG 63 01/30/2012   CHOLHDL 2.6 01/30/2012   Lab Results  Component Value Date   HGBA1C 5.1 07/17/2012   No results found for this basename: [id number]   Lab Results  Component Value Date   TSH 2.581 07/17/2012    NCS/NCV 09/24/12: Bilateral ulnar sensory and motor responses were normal. Bilateral median sensory response showed mildly prolonged peak latency, and normal snap amplitude. Bilateral median motor responses showed mildly prolonged distal latency, with normal cmap amplitude, conduction velocity, and F wave latency. Needle examination of left pronator teres, extensor digital communis, biceps, triceps, deltoid was normal. There was no spontaneous  activity at left cervical paraspinal muscles, left C5, C6 7.  C-Spine 4 View 09/16/12: Normal  Lumbar spine 2 view 09/16/12: Normal  ASSESSMENT AND PLAN  47 y.o. year old female  has a past medical history of Bell's palsy (2000). here with intermittent muscle twitching and mild carpel tunnel syndrome.  Dx: benign faciculations due to anxiety/sleep deprivation/insomnia related   PLAN: 1. Regarding mild carpel tunnel in both wrists, right > left. Use wrist splints as needed and supports for keyboard and computer mouse. 2. Observation; if symptoms worsen in future, may consider addl testing at that time   Suanne Marker, MD LYNN LAM NP-C 02/05/2013, 10:03 AM Certified in Neurology, Neurophysiology and Neuroimaging  [facility] [address] [place], [place] [address] [phone]

## 2013-02-27 ENCOUNTER — Encounter: Payer: Self-pay | Admitting: Gynecology

## 2013-02-27 ENCOUNTER — Ambulatory Visit (INDEPENDENT_AMBULATORY_CARE_PROVIDER_SITE_OTHER): Payer: BC Managed Care – PPO | Admitting: Gynecology

## 2013-02-27 VITALS — BP 124/80 | Ht 65.0 in | Wt 123.0 lb

## 2013-02-27 DIAGNOSIS — L659 Nonscarring hair loss, unspecified: Secondary | ICD-10-CM

## 2013-02-27 DIAGNOSIS — Z01419 Encounter for gynecological examination (general) (routine) without abnormal findings: Secondary | ICD-10-CM

## 2013-02-27 DIAGNOSIS — Z13220 Encounter for screening for lipoid disorders: Secondary | ICD-10-CM

## 2013-02-27 LAB — CBC WITH DIFFERENTIAL/PLATELET
Eosinophils Absolute: 0.1 10*3/uL (ref 0.0–0.7)
Eosinophils Relative: 1 % (ref 0–5)
Hemoglobin: 13.2 g/dL (ref 12.0–15.0)
Lymphs Abs: 2.1 10*3/uL (ref 0.7–4.0)
MCH: 30.3 pg (ref 26.0–34.0)
MCV: 93.3 fL (ref 78.0–100.0)
Monocytes Absolute: 0.8 10*3/uL (ref 0.1–1.0)
Monocytes Relative: 9 % (ref 3–12)
RBC: 4.35 MIL/uL (ref 3.87–5.11)

## 2013-02-27 NOTE — Patient Instructions (Signed)
followup in one year, sooner as needed. 

## 2013-02-27 NOTE — Progress Notes (Signed)
Suzanne Marks 06-Apr-1966 [id number]        47 y.o.  G2P2 for annual exam.  Doing well without complaints.  Past medical history,surgical history, medications, allergies, family history and social history were all reviewed and documented in the EPIC chart.  ROS:  Performed and pertinent positives and negatives are included in the history, assessment and plan .  Exam: Kim assistant Filed Vitals:   02/27/13 1435  BP: 124/80  Height: 5\' 5"  (1.651 m)  Weight: 123 lb (55.792 kg)   General appearance  Normal Skin grossly normal Head/Neck normal with no cervical or supraclavicular adenopathy thyroid normal Lungs  clear Cardiac RR, without RMG Abdominal  soft, nontender, without masses, organomegaly or hernia Breasts  examined lying and sitting without masses, retractions, discharge or axillary adenopathy. Pelvic  Ext/BUS/vagina  normal   Cervix  normal   Uterus  retroverted, normal size, shape and contour, midline and mobile nontender   Adnexa  Without masses or tenderness    Anus and perineum  normal   Rectovaginal  normal sphincter tone without palpated masses or tenderness.    Assessment/Plan:  47 y.o. G2P2 female for annual exam, regular menses, vasectomy birth control.   1. History endometrial ablation doing well with regular lighter menses. Continue to monitor. 2. History of alopecia. She asked about we check a ferritin level that her dermatologist wants. I also ordered a TSH. 3. Pap smear 2012. No Pap smear done today. No history of abnormal Pap smears previously. Plan repeat Pap smear next year 3 year interval. 4. Mammography 07/2012. Continue with annual mammography. His be medically reviewed. 5. Health maintenance. Baseline CBC comprehensive metabolic panel lipid profile urinalysis ordered. Follow up one year, sooner as needed.  Note: This document was prepared with digital dictation and possible smart phrase technology. Any transcriptional errors that result from this  process are unintentional.   Dara Lords MD, 3:13 PM 02/27/2013

## 2013-02-28 LAB — COMPREHENSIVE METABOLIC PANEL WITH GFR
ALT: 10 U/L (ref 0–35)
AST: 16 U/L (ref 0–37)
Albumin: 4.3 g/dL (ref 3.5–5.2)
Alkaline Phosphatase: 47 U/L (ref 39–117)
BUN: 12 mg/dL (ref 6–23)
CO2: 30 meq/L (ref 19–32)
Calcium: 9.5 mg/dL (ref 8.4–10.5)
Chloride: 102 meq/L (ref 96–112)
Creat: 0.66 mg/dL (ref 0.50–1.10)
Glucose, Bld: 109 mg/dL — ABNORMAL HIGH (ref 70–99)
Potassium: 4 meq/L (ref 3.5–5.3)
Sodium: 137 meq/L (ref 135–145)
Total Bilirubin: 0.4 mg/dL (ref 0.3–1.2)
Total Protein: 7 g/dL (ref 6.0–8.3)

## 2013-02-28 LAB — LIPID PANEL
Cholesterol: 188 mg/dL (ref 0–200)
HDL: 75 mg/dL (ref >39)

## 2013-02-28 LAB — URINALYSIS W MICROSCOPIC + REFLEX CULTURE
Bacteria, UA: NONE SEEN
Bilirubin Urine: NEGATIVE
Casts: NONE SEEN
Crystals: NONE SEEN
Glucose, UA: NEGATIVE mg/dL
Hgb urine dipstick: NEGATIVE
Ketones, ur: NEGATIVE mg/dL
Leukocytes, UA: NEGATIVE
Nitrite: NEGATIVE
Protein, ur: NEGATIVE mg/dL
Specific Gravity, Urine: 1.012 (ref 1.005–1.030)
Squamous Epithelial / HPF: NONE SEEN
Urobilinogen, UA: 0.2 mg/dL (ref 0.0–1.0)
pH: 5.5 (ref 5.0–8.0)

## 2013-02-28 LAB — TSH: TSH: 1.897 u[IU]/mL (ref 0.350–4.500)

## 2013-02-28 LAB — FERRITIN: Ferritin: 16 ng/mL (ref 10–291)

## 2013-05-28 ENCOUNTER — Ambulatory Visit (INDEPENDENT_AMBULATORY_CARE_PROVIDER_SITE_OTHER): Payer: BC Managed Care – PPO | Admitting: Nurse Practitioner

## 2013-05-28 ENCOUNTER — Encounter: Payer: Self-pay | Admitting: Nurse Practitioner

## 2013-05-28 ENCOUNTER — Other Ambulatory Visit: Payer: Self-pay | Admitting: Nurse Practitioner

## 2013-05-28 VITALS — BP 132/81 | HR 76 | Temp 97.2°F | Ht 65.5 in | Wt 125.0 lb

## 2013-05-28 DIAGNOSIS — R209 Unspecified disturbances of skin sensation: Secondary | ICD-10-CM

## 2013-05-28 DIAGNOSIS — G5600 Carpal tunnel syndrome, unspecified upper limb: Secondary | ICD-10-CM

## 2013-05-28 DIAGNOSIS — F419 Anxiety disorder, unspecified: Secondary | ICD-10-CM

## 2013-05-28 DIAGNOSIS — F411 Generalized anxiety disorder: Secondary | ICD-10-CM

## 2013-05-28 DIAGNOSIS — R202 Paresthesia of skin: Secondary | ICD-10-CM

## 2013-05-28 DIAGNOSIS — R259 Unspecified abnormal involuntary movements: Secondary | ICD-10-CM

## 2013-05-28 DIAGNOSIS — R253 Fasciculation: Secondary | ICD-10-CM | POA: Insufficient documentation

## 2013-05-28 NOTE — Progress Notes (Signed)
[facility]  PATIENT: Suzanne Marks DOB: [birth year]   REASON FOR VISIT: follow up for muscle twitching HISTORY FROM: patient  HISTORY OF PRESENT ILLNESS: 02/05/13 (VRP):  Suzanne Marks. Remillard is a 47 year old right-handed Caucasian female who comes in for consultation for bilateral arm paresthesias left greater than right and muscle twitching.   Patient reports that earlier in the year she was driving home from [facility] and noticed numbness and tingling in the anterior aspects of her bilateral forearms and some numbness in the left hand while gripping the steering wheel. Symptoms were intermittent and occurred during brief intervals over the next few weeks. She was referred for nerve conduction study and EMG which was completed on 09/24/2012 and showed bilateral ulnar sensory and motor responses were normal. Bilateral median sensory response showed mildly prolonged peak latency. Bilateral median motor responses showed mildly prolonged distal latency consistent with mild carpal tunnel syndrome. Afterwards patient tried wearing wrist splints during the night which were uncomfortable and she started using a keyboard pad and mouse pad. Symptoms disappeared within one week after adding these items and have not recurred.   Patient has noticed that she has increased muscle twitching occurring in bilateral legs and around right eye which started occurring after she found out her mother's brain tumor had returned and she had less than 2 months to live. Patient realizes that she has greatly increased anxiety since finding out about her mother's condition. Twitching is intermittent and last only a few seconds mostly in her legs. She knows of nothing that triggers the symptoms or necessarily relieves his symptoms. Her mother has passed about 2 months ago. She has also been experiencing palpitations which she has seen her primary care provider for and thought to be having panic attacks and was  prescribed prn Xanax. She doesn't like to take this medication and less absolutely needed. She states she went for massage and was told that her muscles were extremely tight all over. She does exercise 1-1-1/2 hours per day to try to help relieve stress and does not consume caffeine on a daily basis. Of note when she went on a week's vacation to the beach, she did not have any of the mentioned symptoms.   05/28/13 (LL):  [name] returns for follow up because her symptoms have not improved.  She is still having  muscle twitching in different areas of her body (calf, face, abdomen, gluteus) and paresthesias in her hands and feet.  She recently had anemia panel done which was normal.  Her HAM-A score is 20 (moderate anxiety).  She admits to being a worrier, but would rather exercise and relax natural ways than take medicines.  She has an Rx for Xanax but has not taken many.  REVIEW OF SYSTEMS: Full 14 system review of systems performed and notable only for:  Psychiatric: anxiety  ALLERGIES: Allergies  Allergen Reactions  . Lanolin     topical    HOME MEDICATIONS: Outpatient Prescriptions Prior to Visit  Medication Sig Dispense Refill  . ALPRAZolam (XANAX) 0.25 MG tablet Take 1 tablet by mouth as needed.      . fish oil-omega-3 fatty acids 1000 MG capsule Take 2 g by mouth daily.      . IRON PO Take by mouth.      . Multiple Vitamin (MULTIVITAMIN) tablet Take 1 tablet by mouth daily.       No facility-administered medications prior to visit.    PAST MEDICAL HISTORY: Past Medical  History  Diagnosis Date  . Bell's palsy 2000    PAST SURGICAL HISTORY: Past Surgical History  Procedure Laterality Date  . Novasure ablation  03/2006  . External ear surgery      age 68  . Gum surgery  2011  . Foot surgery  2012    left  . Wisdom tooth extraction  1990  . Refractive surgery      FAMILY HISTORY: Family History  Problem Relation Age of Onset  . Hypertension Father   . Cancer Mother      Glioblastoma  . Seizures Mother   . Heart Problems Maternal Grandmother   . Parkinsonism Maternal Grandmother   . Depression Maternal Grandmother   . Heart Problems Maternal Grandfather   . Heart Problems Paternal Grandmother   . Heart Problems Paternal Grandfather     SOCIAL HISTORY: History   Social History  . Marital Status: Married    Spouse Name: Channing Mutters    Number of Children: 2  . Years of Education: College   Occupational History  .  [employer]   Social History Main Topics  . Smoking status: Never Smoker   . Smokeless tobacco: Never Used  . Alcohol Use: Yes     Comment: Rare  . Drug Use: No  . Sexual Activity: Yes    Birth Control/ Protection: Other-see comments, Surgical     Comment: vasectomy   Other Topics Concern  . Not on file   Social History Narrative   Patient lives at home with her family.   Caffeine Use: occasionally    PHYSICAL EXAM  Filed Vitals:   05/28/13 0907  BP: 132/81  Pulse: 76  Temp: 97.2 F (36.2 C)  TempSrc: Oral  Height: 5' 5.5" (1.664 m)  Weight: 125 lb (56.7 kg)   Body mass index is 20.48 kg/(m^2).  Generalized: In no acute distress, pleasant Caucasian female well developed, well-groomed  Neck: Supple, no carotid bruits  Cardiac: Regular rate rhythm, no murmur  Pulmonary: Clear to auscultation bilaterally  Musculoskeletal: No deformity   Neurological examination  Mentation: Alert oriented to time, place, history taking, language fluent, and casual conversation  Cranial nerve II-XII: Pupils were equal round reactive to light extra ocular movements were full, visual field were full on confrontational test. Facial sensation and strength were normal. hearing was intact to finger rubbing bilaterally. Uvula tongue midline. head turning and shoulder shrug and were normal and symmetric. Tongue protrusion into cheek strength was normal.  MOTOR: normal bulk and tone, full strength in the BUE, BLE, fine finger  movements normal, no pronator drift; RARE MUSCLE TWITCHES NOTED IN RT EYELID SENSORY: normal and symmetric to light touch, pinprick, temperature, vibration and proprioception  COORDINATION: finger-nose-finger, heel-to-shin bilaterally, there was no truncal ataxia  REFLEXES: All deep tendon reflexes are symmetric and brisk 3+. plantar responses were flexor bilaterally.  GAIT/STATION: Rising up from seated position without assistance, normal stance, without trunk ataxia, moderate stride, good arm swing, smooth turning, able to perform tiptoe, and heel walking without difficulty.   DIAGNOSTIC DATA (LABS, IMAGING, TESTING) - I reviewed patient records, labs, notes, testing and imaging myself where available.  Lab Results  Component Value Date   WBC 8.8 02/27/2013   HGB 13.2 02/27/2013   HCT 40.6 02/27/2013   MCV 93.3 02/27/2013   PLT 346 02/27/2013      Component Value Date/Time   NA 137 02/27/2013 1501   K 4.0 02/27/2013 1501   CL 102 02/27/2013 1501  CO2 30 02/27/2013 1501   GLUCOSE 109* 02/27/2013 1501   BUN 12 02/27/2013 1501   CREATININE 0.66 02/27/2013 1501   CALCIUM 9.5 02/27/2013 1501   PROT 7.0 02/27/2013 1501   ALBUMIN 4.3 02/27/2013 1501   AST 16 02/27/2013 1501   ALT 10 02/27/2013 1501   ALKPHOS 47 02/27/2013 1501   BILITOT 0.4 02/27/2013 1501   Lab Results  Component Value Date   CHOL 188 02/27/2013   HDL 75 02/27/2013   LDLCALC 811* 02/27/2013   TRIG 66 02/27/2013   CHOLHDL 2.5 02/27/2013   Lab Results  Component Value Date   HGBA1C 5.1 07/17/2012   No results found for this basename: [id number]   Lab Results  Component Value Date   TSH 1.897 02/27/2013   NCS/NCV 09/24/12:  Bilateral ulnar sensory and motor responses were normal. Bilateral median sensory response showed mildly prolonged peak latency, and normal snap amplitude. Bilateral median motor responses showed mildly prolonged distal latency, with normal cmap amplitude, conduction velocity, and F wave latency. Needle examination of left pronator  teres, extensor digital communis, biceps, triceps, deltoid was normal. There was no spontaneous activity at left cervical paraspinal muscles, left C5, C6 7.  C-Spine 4 View 09/16/12:   Normal  Lumbar spine 2 view 09/16/12:   Normal  Ferritin 16, Anemia panel, TSH normal, 02/27/13  ASSESSMENT AND PLAN  47 y.o. year old female has a past medical history of Bell's palsy (2000) here with intermittent muscle twitching, paresthesias and mild carpel tunnel syndrome.  Muscle twitching, paresthesias have continued since last visit.  We will check NCV/EMG and MRI Brain to further workup.  PLAN:  Check MRI Brain with and without Contrast to evaluate for demyelinating pathology/tumor. Check EMG of upper and lower extremities. Regarding mild carpel tunnel in both wrists, right > left.  Continue to use wrist splints as needed and supports for keyboard and computer mouse.   Orders Placed This Encounter  Procedures  . MR Brain W Wo Contrast  . NCV with EMG(electromyography)   Tawny Asal LAM, MSN, NP-C 05/28/2013, 1:13 PM [facility] [address] [place], [place] [address] [phone]  Note: This document was prepared with digital dictation and possible smart phrase technology. Any transcriptional errors that result from this process are unintentional.

## 2013-05-28 NOTE — Patient Instructions (Addendum)
Check MRI Brain and cervical spine with and without Contrast to evaluate for demyelinating pathology. I have requested [facility] or [facility] MRI, someone will call you to schedule. Also a repeat EMG/NCV that will be done here in our office. if all normal, this is likely anxiety, your neurological exam is well.

## 2013-05-29 ENCOUNTER — Other Ambulatory Visit: Payer: Self-pay

## 2013-06-04 ENCOUNTER — Ambulatory Visit (INDEPENDENT_AMBULATORY_CARE_PROVIDER_SITE_OTHER): Payer: BC Managed Care – PPO | Admitting: Diagnostic Neuroimaging

## 2013-06-04 ENCOUNTER — Encounter (INDEPENDENT_AMBULATORY_CARE_PROVIDER_SITE_OTHER): Payer: Self-pay | Admitting: Radiology

## 2013-06-04 DIAGNOSIS — Z0289 Encounter for other administrative examinations: Secondary | ICD-10-CM

## 2013-06-04 DIAGNOSIS — R253 Fasciculation: Secondary | ICD-10-CM

## 2013-06-04 DIAGNOSIS — R259 Unspecified abnormal involuntary movements: Secondary | ICD-10-CM

## 2013-06-04 DIAGNOSIS — R202 Paresthesia of skin: Secondary | ICD-10-CM

## 2013-06-04 DIAGNOSIS — G5600 Carpal tunnel syndrome, unspecified upper limb: Secondary | ICD-10-CM

## 2013-06-04 DIAGNOSIS — F419 Anxiety disorder, unspecified: Secondary | ICD-10-CM

## 2013-06-04 NOTE — Procedures (Signed)
   [facility]  NCS (NERVE CONDUCTION STUDY) WITH EMG (ELECTROMYOGRAPHY) REPORT   STUDY DATE: 06/04/13 PATIENT NAME: Suzanne Marks DOB: [birth year] MRN: [id number]  ORDERING CLINICIAN: Heide Guile, NP / Joycelyn Schmid, MD   TECHNOLOGIST: Gearldine Shown ELECTROMYOGRAPHER: Glenford Bayley. Penumalli, MD  CLINICAL INFORMATION: 47 year old female with muscle twitching.  FINDINGS: NERVE CONDUCTION STUDY: Bilateral median and right ulnar motor responses have normal distal latencies, amplitudes, conduction velocities and F-wave latencies. Bilateral peroneal, tibial motor responses have normal distal latencies, amplitudes, conduction velocities and F-wave latencies. Bilateral H reflex responses are normal. Bilateral median sensory responses have borderline prolonged latencies and normal amplitudes. Right ulnar, bilateral peroneal sensory responses are normal.  NEEDLE ELECTROMYOGRAPHY: Needle examination of selected muscles of right lower extremity (vastus medialis, tibialis anterior, gastrocnemius) shows no abnormal spontaneous occurred at rest and normal motor unit recruitment on exertion.  IMPRESSION:  Mild abnormal study demonstrating: 1. 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 2. No evidence of diffuse large fiber neuropathy or myopathy at this time 3. Compared to prior study from 09/24/12, right median motor response distal latency has improved otherwise no significant change.   INTERPRETING PHYSICIAN:  Suanne Marker, MD Certified in Neurology, Neurophysiology and Neuroimaging  [facility] [address] [place], [place] [address] [phone]

## 2013-06-11 ENCOUNTER — Telehealth: Payer: Self-pay | Admitting: *Deleted

## 2013-06-11 NOTE — Telephone Encounter (Signed)
I called pt and she had received the results of EMG/NCS asking about MRI.  I spoke with Renee and pt to be called about her MRI, possibly tomorrow.  Use Cell #.  Pt relayed understanding.

## 2013-06-13 ENCOUNTER — Ambulatory Visit
Admission: RE | Admit: 2013-06-13 | Discharge: 2013-06-13 | Disposition: A | Discharge status: Home / Self Care | Payer: BC Managed Care – PPO | Source: Ambulatory Visit | Attending: Nurse Practitioner | Admitting: Nurse Practitioner

## 2013-06-13 DIAGNOSIS — F419 Anxiety disorder, unspecified: Secondary | ICD-10-CM

## 2013-06-13 DIAGNOSIS — R253 Fasciculation: Secondary | ICD-10-CM

## 2013-06-13 DIAGNOSIS — R202 Paresthesia of skin: Secondary | ICD-10-CM

## 2013-06-13 DIAGNOSIS — R209 Unspecified disturbances of skin sensation: Secondary | ICD-10-CM

## 2013-06-13 DIAGNOSIS — G5600 Carpal tunnel syndrome, unspecified upper limb: Secondary | ICD-10-CM

## 2013-06-13 MED ORDER — GADOBENATE DIMEGLUMINE 529 MG/ML IV SOLN
11.0000 mL | Freq: Once | INTRAVENOUS | Status: AC | PRN
  Administered 2013-06-13: 11 mL via INTRAVENOUS

## 2013-06-16 ENCOUNTER — Other Ambulatory Visit: Payer: Self-pay

## 2013-06-16 DIAGNOSIS — Z1231 Encounter for screening mammogram for malignant neoplasm of breast: Secondary | ICD-10-CM

## 2013-07-03 NOTE — Progress Notes (Signed)
I reviewed note and agree with plan.   VIKRAM R. PENUMALLI, MD  Certified in Neurology, Neurophysiology and Neuroimaging  [facility] [address], [address] [place], [place] [address] [phone]   

## 2013-08-15 ENCOUNTER — Ambulatory Visit
Admission: RE | Admit: 2013-08-15 | Discharge: 2013-08-15 | Disposition: A | Discharge status: Home / Self Care | Payer: BC Managed Care – PPO | Source: Ambulatory Visit

## 2013-08-15 DIAGNOSIS — Z1231 Encounter for screening mammogram for malignant neoplasm of breast: Secondary | ICD-10-CM

## 2013-12-31 ENCOUNTER — Encounter (HOSPITAL_COMMUNITY): Payer: Self-pay | Admitting: Pharmacy Technician

## 2014-01-08 ENCOUNTER — Encounter (HOSPITAL_COMMUNITY): Payer: Self-pay | Admitting: *Deleted

## 2014-01-19 ENCOUNTER — Other Ambulatory Visit: Payer: Self-pay | Admitting: Gastroenterology

## 2014-01-19 NOTE — Addendum Note (Signed)
Addended by: Arta Silence on: 01/19/2014 05:19 PM   Modules accepted: Orders

## 2014-01-20 NOTE — Anesthesia Preprocedure Evaluation (Addendum)
Anesthesia Evaluation  Patient identified by MRN, date of birth, ID band Patient awake    Reviewed: Allergy & Precautions, H&P , NPO status , Patient's Chart, lab work & pertinent test results  Airway Mallampati: II TM Distance: >3 FB Neck ROM: full    Dental no notable dental hx. (+) Teeth Intact, Dental Advisory Given   Pulmonary neg pulmonary ROS,  breath sounds clear to auscultation  Pulmonary exam normal       Cardiovascular Exercise Tolerance: Good negative cardio ROS  Rhythm:regular Rate:Normal     Neuro/Psych negative neurological ROS  negative psych ROS   GI/Hepatic negative GI ROS, Neg liver ROS,   Endo/Other  negative endocrine ROS  Renal/GU negative Renal ROS  negative genitourinary   Musculoskeletal   Abdominal   Peds  Hematology negative hematology ROS (+)   Anesthesia Other Findings   Reproductive/Obstetrics negative OB ROS                           Anesthesia Physical Anesthesia Plan  ASA: II  Anesthesia Plan: MAC   Post-op Pain Management:    Induction:   Airway Management Planned:   Additional Equipment:   Intra-op Plan:   Post-operative Plan:   Informed Consent: I have reviewed the patients History and Physical, chart, labs and discussed the procedure including the risks, benefits and alternatives for the proposed anesthesia with the patient or authorized representative who has indicated his/her understanding and acceptance.   Dental Advisory Given  Plan Discussed with: CRNA and Surgeon  Anesthesia Plan Comments:         Anesthesia Quick Evaluation

## 2014-01-21 ENCOUNTER — Encounter (HOSPITAL_COMMUNITY): Payer: Self-pay

## 2014-01-21 ENCOUNTER — Ambulatory Visit (HOSPITAL_COMMUNITY): Payer: BC Managed Care – PPO | Admitting: Anesthesiology

## 2014-01-21 ENCOUNTER — Encounter (HOSPITAL_COMMUNITY): Payer: BC Managed Care – PPO | Admitting: Anesthesiology

## 2014-01-21 ENCOUNTER — Encounter (HOSPITAL_COMMUNITY)
Admission: RE | Disposition: A | Discharge status: Home / Self Care | Payer: Self-pay | Source: Ambulatory Visit | Attending: Gastroenterology

## 2014-01-21 ENCOUNTER — Ambulatory Visit (HOSPITAL_COMMUNITY)
Admission: RE | Admit: 2014-01-21 | Discharge: 2014-01-21 | Disposition: A | Discharge status: Home / Self Care | Payer: BC Managed Care – PPO | Source: Ambulatory Visit | Attending: Gastroenterology | Admitting: Gastroenterology

## 2014-01-21 DIAGNOSIS — K219 Gastro-esophageal reflux disease without esophagitis: Secondary | ICD-10-CM | POA: Insufficient documentation

## 2014-01-21 HISTORY — DX: Major depressive disorder, single episode, unspecified: F32.9

## 2014-01-21 HISTORY — PX: BRAVO PH STUDY: SHX5421

## 2014-01-21 HISTORY — DX: Depression, unspecified: F32.A

## 2014-01-21 HISTORY — DX: Carpal tunnel syndrome, unspecified upper limb: G56.00

## 2014-01-21 HISTORY — DX: Anxiety disorder, unspecified: F41.9

## 2014-01-21 HISTORY — PX: ESOPHAGOGASTRODUODENOSCOPY (EGD) WITH PROPOFOL: SHX5813

## 2014-01-21 HISTORY — DX: Abnormal level of blood mineral: R79.0

## 2014-01-21 HISTORY — DX: Gastro-esophageal reflux disease without esophagitis: K21.9

## 2014-01-21 SURGERY — ESOPHAGOGASTRODUODENOSCOPY (EGD) WITH PROPOFOL
Anesthesia: Monitor Anesthesia Care

## 2014-01-21 MED ORDER — PROPOFOL INFUSION 10 MG/ML OPTIME
INTRAVENOUS | Status: DC | PRN
  Administered 2014-01-21: 200 ug/kg/min via INTRAVENOUS

## 2014-01-21 MED ORDER — LIDOCAINE HCL (CARDIAC) 20 MG/ML IV SOLN
INTRAVENOUS | Status: AC
  Filled 2014-01-21: qty 5

## 2014-01-21 MED ORDER — LACTATED RINGERS IV SOLN
INTRAVENOUS | Status: DC
Start: 2014-01-21 — End: 2014-01-21
  Administered 2014-01-21: 1000 mL via INTRAVENOUS

## 2014-01-21 MED ORDER — SODIUM CHLORIDE 0.9 % IV SOLN: INTRAVENOUS | Status: DC

## 2014-01-21 MED ORDER — BUTAMBEN-TETRACAINE-BENZOCAINE 2-2-14 % EX AERO
INHALATION_SPRAY | CUTANEOUS | Status: DC | PRN
  Administered 2014-01-21: 2 via TOPICAL

## 2014-01-21 MED ORDER — FENTANYL CITRATE 0.05 MG/ML IJ SOLN
INTRAMUSCULAR | Status: AC
  Filled 2014-01-21: qty 2

## 2014-01-21 MED ORDER — MIDAZOLAM HCL 2 MG/2ML IJ SOLN
INTRAMUSCULAR | Status: AC
  Filled 2014-01-21: qty 2

## 2014-01-21 MED ORDER — PROPOFOL 10 MG/ML IV BOLUS
INTRAVENOUS | Status: AC
  Filled 2014-01-21: qty 20

## 2014-01-21 MED ORDER — GLYCOPYRROLATE 0.2 MG/ML IJ SOLN
INTRAMUSCULAR | Status: DC | PRN
  Administered 2014-01-21: 0.2 mg via INTRAVENOUS

## 2014-01-21 MED ORDER — GLYCOPYRROLATE 0.2 MG/ML IJ SOLN
INTRAMUSCULAR | Status: AC
  Filled 2014-01-21: qty 1

## 2014-01-21 MED ORDER — FENTANYL CITRATE 0.05 MG/ML IJ SOLN: 25.0000 ug | INTRAMUSCULAR | Status: DC | PRN

## 2014-01-21 MED ORDER — LIDOCAINE HCL (CARDIAC) 20 MG/ML IV SOLN
INTRAVENOUS | Status: DC | PRN
  Administered 2014-01-21: 75 mg via INTRAVENOUS

## 2014-01-21 SURGICAL SUPPLY — 14 items

## 2014-01-21 NOTE — Discharge Instructions (Signed)
Endoscopy (Upper endoscopy with Bravo capsule placement)  Care After Please read the instructions outlined below and refer to this sheet in the next few weeks. These discharge instructions provide you with general information on caring for yourself after you leave the hospital. Your doctor may also give you specific instructions. While your treatment has been planned according to the most current medical practices available, unavoidable complications occasionally occur. If you have any problems or questions after discharge, please call Dr. Paulita Fujita [facility]) at [phone].  HOME CARE INSTRUCTIONS Activity  You may resume your regular activity but move at a slower pace for the next 24 hours.   Take frequent rest periods for the next 24 hours.   Walking will help expel (get rid of) the air and reduce the bloated feeling in your abdomen.   No driving for 24 hours (because of the anesthesia (medicine) used during the test).   You may shower.   Do not sign any important legal documents or operate any machinery for 24 hours (because of the anesthesia used during the test).  Nutrition  Drink plenty of fluids.   You may resume your normal diet.   Begin with a light meal and progress to your normal diet.   Avoid alcoholic beverages for 24 hours or as instructed by your caregiver.  Medications You may resume your normal medications unless your caregiver tells you otherwise. What you can expect today  You may experience abdominal discomfort such as a feeling of fullness or "gas" pains.   You may experience a sore throat for 2 to 3 days. This is normal. Gargling with salt water may help this.    SEEK IMMEDIATE MEDICAL CARE IF:  You have excessive nausea (feeling sick to your stomach) and/or vomiting.   You have severe abdominal pain and distention (swelling).   You have trouble swallowing.   You have a temperature over 100 F (37.8 C).   You have rectal bleeding or  vomiting of blood.  Document Released: 02/22/2004 Document Revised: 03/22/2011 Document Reviewed: 09/04/2007 [facility] Patient Information 2012 [place].

## 2014-01-21 NOTE — H&P (Signed)
Patient interval history reviewed.  Patient examined again.  There has been no change from documented H/P dated 01/20/14  (scanned into chart from our office) except as documented above.  Assessment:  1.  GERD, refractory to medical therapy.  Plan:  1.  Endoscopy with Bravo capsule placement. 2.  Risks (bleeding, infection, bowel perforation that could require surgery, sedation-related changes in cardiopulmonary systems), benefits (identification and possible treatment of source of symptoms, exclusion of certain causes of symptoms), and alternatives (watchful waiting, radiographic imaging studies, empiric medical treatment) of upper endoscopy with Bravo capsule placement (EGD with bravo) were explained to patient/family in detail and patient wishes to proceed.

## 2014-01-21 NOTE — Transfer of Care (Signed)
Immediate Anesthesia Transfer of Care Note  Patient: Suzanne Marks  Procedure(s) Performed: Procedure(s): ESOPHAGOGASTRODUODENOSCOPY (EGD) WITH PROPOFOL (N/A) BRAVO PH STUDY (N/A)  Patient Location: PACU  Anesthesia Type:MAC  Level of Consciousness: awake, alert , oriented and patient cooperative  Airway & Oxygen Therapy: Patient Spontanous Breathing and Patient connected to nasal cannula oxygen  Post-op Assessment: Report given to PACU RN, Post -op Vital signs reviewed and stable and Patient moving all extremities X 4  Post vital signs: stable  Complications: No apparent anesthesia complications

## 2014-01-21 NOTE — Anesthesia Postprocedure Evaluation (Signed)
  Anesthesia Post-op Note  Patient: Suzanne Marks  Procedure(s) Performed: Procedure(s) (LRB): ESOPHAGOGASTRODUODENOSCOPY (EGD) WITH PROPOFOL (N/A) BRAVO PH STUDY (N/A)  Patient Location: PACU  Anesthesia Type: MAC  Level of Consciousness: awake and alert   Airway and Oxygen Therapy: Patient Spontanous Breathing  Post-op Pain: mild  Post-op Assessment: Post-op Vital signs reviewed, Patient's Cardiovascular Status Stable, Respiratory Function Stable, Patent Airway and No signs of Nausea or vomiting  Last Vitals:  Filed Vitals:   01/21/14 0944  BP: 124/74  Pulse:   Temp:   Resp: 13    Post-op Vital Signs: stable   Complications: No apparent anesthesia complications

## 2014-01-21 NOTE — Op Note (Signed)
[facility] [place], [address]   ENDOSCOPY PROCEDURE REPORT  PATIENT: Suzanne Marks, Suzanne Marks  MR#: [id number] BIRTHDATE: 12/02/1965 , 19  yrs. old GENDER: Female ENDOSCOPIST: Arta Silence, MD REFERRED BY:  Jonathon Jordan, M.D. PROCEDURE DATE:  01/21/2014 PROCEDURE:  EGD w/ Bravo capsule placement ASA CLASS:     Class II INDICATIONS:  GERD, refractory to medical therapy, globus sensation.  MEDICATIONS: MAC sedation, administered by CRNA TOPICAL ANESTHETIC:  DESCRIPTION OF PROCEDURE: After the risks benefits and alternatives of the procedure were thoroughly explained, informed consent was obtained.  The Pentax Gastroscope [id number] endoscope was introduced through the mouth and advanced to the second portion of the duodenum. Without limitations.  The instrument was slowly withdrawn as the mucosa was fully examined.     Findings:  Small hiatal hernia.  Otherwise normal endoscopy to the second portion of the duodenum.  After completion of our diagnostic endoscopy, a Bravo capsule was placed 6cm proximal to the GE junction employing standard protocol.  Appropriate positioning of the Bravo was confirmed endoscopically.              The scope was then withdrawn from the patient and the procedure completed.  ENDOSCOPIC IMPRESSION:     Small hiatal hernia, otherwise normal endoscopy.  Successful Bravo capsule placement.  RECOMMENDATIONS:     1.  Watch for potential complications of procedure. 2.  Continue Dexilant. 3.  Await Bravo capsule results. 4.  Follow-up with [facility] in 6-8 weeks.  eSigned:  Arta Silence, MD 01/21/2014 9:19 AM   CC:

## 2014-01-22 ENCOUNTER — Encounter (HOSPITAL_COMMUNITY): Payer: Self-pay | Admitting: Gastroenterology

## 2014-03-02 ENCOUNTER — Other Ambulatory Visit: Payer: Self-pay | Admitting: Obstetrics & Gynecology

## 2014-03-02 ENCOUNTER — Other Ambulatory Visit (HOSPITAL_COMMUNITY)
Admission: RE | Admit: 2014-03-02 | Discharge: 2014-03-02 | Disposition: A | Discharge status: Home / Self Care | Payer: BC Managed Care – PPO | Source: Ambulatory Visit | Attending: Obstetrics & Gynecology | Admitting: Obstetrics & Gynecology

## 2014-03-02 DIAGNOSIS — Z01419 Encounter for gynecological examination (general) (routine) without abnormal findings: Secondary | ICD-10-CM | POA: Insufficient documentation

## 2014-03-02 DIAGNOSIS — Z1151 Encounter for screening for human papillomavirus (HPV): Secondary | ICD-10-CM | POA: Insufficient documentation

## 2014-03-03 LAB — CYTOLOGY - PAP

## 2014-05-25 ENCOUNTER — Encounter (HOSPITAL_COMMUNITY): Payer: Self-pay | Admitting: Gastroenterology

## 2014-07-10 ENCOUNTER — Other Ambulatory Visit: Payer: Self-pay

## 2014-07-10 DIAGNOSIS — Z1231 Encounter for screening mammogram for malignant neoplasm of breast: Secondary | ICD-10-CM

## 2014-08-17 ENCOUNTER — Ambulatory Visit
Admission: RE | Admit: 2014-08-17 | Discharge: 2014-08-17 | Disposition: A | Discharge status: Home / Self Care | Payer: BLUE CROSS/BLUE SHIELD | Source: Ambulatory Visit

## 2014-08-17 DIAGNOSIS — Z1231 Encounter for screening mammogram for malignant neoplasm of breast: Secondary | ICD-10-CM

## 2015-06-29 ENCOUNTER — Other Ambulatory Visit: Payer: Self-pay

## 2015-06-29 DIAGNOSIS — Z1231 Encounter for screening mammogram for malignant neoplasm of breast: Secondary | ICD-10-CM

## 2015-07-28 ENCOUNTER — Other Ambulatory Visit: Payer: Self-pay | Admitting: Obstetrics & Gynecology

## 2015-07-28 DIAGNOSIS — R52 Pain, unspecified: Secondary | ICD-10-CM

## 2015-08-19 ENCOUNTER — Ambulatory Visit: Payer: BLUE CROSS/BLUE SHIELD

## 2015-08-20 ENCOUNTER — Ambulatory Visit
Admission: RE | Admit: 2015-08-20 | Discharge: 2015-08-20 | Disposition: A | Discharge status: Home / Self Care | Payer: BLUE CROSS/BLUE SHIELD | Source: Ambulatory Visit | Attending: Obstetrics & Gynecology | Admitting: Obstetrics & Gynecology

## 2015-08-20 ENCOUNTER — Other Ambulatory Visit: Payer: BLUE CROSS/BLUE SHIELD

## 2015-08-20 DIAGNOSIS — R52 Pain, unspecified: Secondary | ICD-10-CM

## 2016-07-18 ENCOUNTER — Other Ambulatory Visit: Payer: Self-pay | Admitting: Obstetrics & Gynecology

## 2016-07-18 DIAGNOSIS — Z1231 Encounter for screening mammogram for malignant neoplasm of breast: Secondary | ICD-10-CM

## 2016-07-27 ENCOUNTER — Other Ambulatory Visit: Payer: Self-pay | Admitting: Obstetrics & Gynecology

## 2016-08-21 ENCOUNTER — Ambulatory Visit
Admission: RE | Admit: 2016-08-21 | Discharge: 2016-08-21 | Disposition: A | Discharge status: Home / Self Care | Payer: No Typology Code available for payment source | Source: Ambulatory Visit | Attending: Obstetrics & Gynecology | Admitting: Obstetrics & Gynecology

## 2016-08-21 DIAGNOSIS — Z1231 Encounter for screening mammogram for malignant neoplasm of breast: Secondary | ICD-10-CM

## 2016-09-06 NOTE — Patient Instructions (Signed)
Your procedure is scheduled on:  Wednesday, Feb. 28, 2018  Enter through the [employer] [facility] at:  6:00 AM  Pick up the phone at the desk and dial [phone].  Call this number if you have problems the morning of surgery: [phone].  Remember: Do NOT eat food or drink after:  Midnight Tuesday, Feb. 27, 2018  Take these medicines the morning of surgery with a SIP OF WATER:  None  Stop ALL herbal medications at this time  Do NOT smoke the day of surgery.  Do NOT wear jewelry (body piercing), metal hair clips/bobby pins, make-up, or nail polish. Do NOT wear lotions, powders, or perfumes.  You may wear deodorant. Do NOT shave for 48 hours prior to surgery. Do NOT bring valuables to the hospital. Contacts, dentures, or bridgework may not be worn into surgery.  Leave suitcase in car.  After surgery it may be brought to your room.  For patients admitted to the hospital, checkout time is 11:00 AM the day of discharge.   Bring a copy of your healthcare power of attorney and living will documents.  **Effective Friday, Jan. 12, 2018, [facility] will implement no hospital visitations from children age 48 and younger due to a steady increase in flu activity in our community and hospitals. **

## 2016-09-07 ENCOUNTER — Encounter (HOSPITAL_COMMUNITY)
Admission: RE | Admit: 2016-09-07 | Discharge: 2016-09-07 | Disposition: A | Discharge status: Home / Self Care | Payer: No Typology Code available for payment source | Source: Ambulatory Visit | Attending: Obstetrics & Gynecology | Admitting: Obstetrics & Gynecology

## 2016-09-07 ENCOUNTER — Encounter (HOSPITAL_COMMUNITY): Payer: Self-pay

## 2016-09-07 DIAGNOSIS — Z01812 Encounter for preprocedural laboratory examination: Secondary | ICD-10-CM | POA: Insufficient documentation

## 2016-09-07 HISTORY — DX: Anemia, unspecified: D64.9

## 2016-09-07 LAB — TYPE AND SCREEN
ABO/RH(D): O NEG
ANTIBODY SCREEN: NEGATIVE

## 2016-09-07 LAB — COMPREHENSIVE METABOLIC PANEL
ALT: 34 U/L (ref 14–54)
ANION GAP: 7 (ref 5–15)
AST: 38 U/L (ref 15–41)
Albumin: 4.1 g/dL (ref 3.5–5.0)
Alkaline Phosphatase: 58 U/L (ref 38–126)
BUN: 10 mg/dL (ref 6–20)
CHLORIDE: 99 mmol/L — AB (ref 101–111)
CO2: 29 mmol/L (ref 22–32)
Calcium: 8.7 mg/dL — ABNORMAL LOW (ref 8.9–10.3)
Creatinine, Ser: 0.61 mg/dL (ref 0.44–1.00)
Glucose, Bld: 114 mg/dL — ABNORMAL HIGH (ref 65–99)
POTASSIUM: 3.7 mmol/L (ref 3.5–5.1)
SODIUM: 135 mmol/L (ref 135–145)
Total Bilirubin: 0.5 mg/dL (ref 0.3–1.2)
Total Protein: 6.5 g/dL (ref 6.5–8.1)

## 2016-09-07 LAB — CBC
HCT: 36.8 % (ref 36.0–46.0)
Hemoglobin: 12.7 g/dL (ref 12.0–15.0)
MCH: 31.8 pg (ref 26.0–34.0)
MCHC: 34.5 g/dL (ref 30.0–36.0)
MCV: 92.2 fL (ref 78.0–100.0)
PLATELETS: 304 10*3/uL (ref 150–400)
RBC: 3.99 MIL/uL (ref 3.87–5.11)
RDW: 12.8 % (ref 11.5–15.5)
WBC: 6.9 10*3/uL (ref 4.0–10.5)

## 2016-09-07 LAB — ABO/RH: ABO/RH(D): O NEG

## 2016-09-15 NOTE — H&P (Signed)
51yo G2P2 who presents for preop appt for scheduled LAVH, BS on 2/28 due to continued AUB. In review, periods have become increasingly irregular over the past several months. While it's mostly just light spotting it will last for over a week and recently, she has been spotting for over a month. (See below). The good news is that for the past 12 days she has had no bleeding. Of note, pt had a h/o ablation performed over 50yrs ago.  50-11- light spotting Aug 17-Sep 4- light spotting, barely noted when she wiped 9/21-9/28- first 3 days heavy then lightened up 10/18 x 9 days- moderate then 4 days of light bleeding 11/12- best month 7 days moderate bleeding 12/6- until about mid Feb- had daily light bleeding. During this time, she would have episodes of complete "gushes" were she has soaked through her pad. No dysmenorrhea. Currently on high dose of ferritin, with continued iron-deficiency anemia. At this point, she is sick of continued bleeding and she would prefer to avoid medications/putting hormones in her body and would prefer to proceed with surgical management.  Prior SHG (08/21/16) showed: 9.9cm retroverted uterus with multiple fibroids- 2.5cm fibriod within uterine cavity. Other midbody fibroids ~ 2cm in size (3). Right ovary with simple 4cm avascular cyst. Normal left ovary. 07/2016: EMB negative.   Current Medications  Taking   Fusion Plus - Capsule 1 capsule Orally once a day   Magnesium 300 MG Capsule 1 capsule with a meal Orally Once a day   Vitamin D (Cholecalciferol) 1000 UNIT Capsule 2 tablets Orally Once a day   Aloe - Liquid Orally   Grape Seed Extract 100 MG Capsule Orally   Medication List reviewed and reconciled with the patient    Past Medical History  Derm: Amy [place].   white coat HTN.   Low ferritin.   GERD.   Depression with anxiety.           Surgical History  lasik 05/2011  Foot surgery, Bunionectomy 2012  Novasure endometriall ablation 03/2006  Gum  surgery   ear surgery   EGD w/Bravo 01/2014   Family History  Father: alive 35 yrs, diagnosed with Hypertension  Mother: deceased 70 yrs  Sister 1: alive 1 yrs  1 sister(s) .   Negative family history of colon cancer, or liver disease, denies any GYN family cancer hx.   Social History  General:  Tobacco use  cigarettes: Never smoked Tobacco history last updated 09/15/2016 no EXPOSURE TO PASSIVE SMOKE.  Alcohol: yes, 1-2 beers a week.  Caffeine: yes.  no Recreational drug use.  Exercise: yes, Daily walk, kickboxing 5-6 times a week .  Marital Status: married.  Children: 2 girls .  OCCUPATION: employed, Mudlogger at [employer].    Gyn History  Sexual activity currently sexually active.  Periods : becoming irregular and lasting about 2 weeks .  LMP 06/28/2016-09/02/16.  Birth control vasectomy.  Last pap smear date 03/02/2014.  Last mammogram date 08/20/2015, [place] .  Abnormal pap smear none.    OB History  Pregnancy # 1 live birth, vaginal delivery.  Pregnancy # 2 live birth, vaginal delivery.    Allergies  Lanolin topical [place] lotion: rash: Allergy   Hospitalization/Major Diagnostic Procedure  Childbirth x 2   not in past year 12/2015   Review of Systems  CONSTITUTIONAL:  no Chills. no Fever. Night sweats yes.  HEENT:  Blurrred vision no. no Double vision.  CARDIOLOGY:  no Chest pain.  RESPIRATORY:  no Shortness of breath. no Cough.  UROLOGY:  no Urinary frequency. no Urinary incontinence. no Urinary urgency.  GASTROENTEROLOGY:  no Abdominal pain. no Appetite change. no Change in bowel movements.  FEMALE REPRODUCTIVE:  no Breast pain. no Unusual vaginal discharge. no Vaginal irritation. no Vaginal itching. no Vaginal spotting.  NEUROLOGY:  no Dizziness. no Headache. no Loss of consciousness.  PSYCHOLOGY:  Anxiety somewhat. no Depression.  SKIN:  no Rash. no Hives.  HEMATOLOGY/LYMPH:  no Anemia. no Fatigue. Using Blood Thinners no.      Examination: Vital Signs  Wt 116, Wt change -1.5 lb, Pulse sitting 71, BP sitting 121/75.   Examination  General Examination: GENERAL APPEARANCE well developed, well nourished .  SKIN: warm and dry, no rashes .  NECK: supple, normal appearance .  LUNGS: regular breathing rate and effort .  HEART: regular rate and rhythm.  ABDOMEN: soft and not tender, firm-non-tender uterus, no rebound, no rigidity .  FEMALE GENITOURINARY: deferred.  EXTREMITIES: no edema present .  PSYCH: appropriate mood and affect .      CBC    Component Value Date/Time   WBC 6.9 09/07/2016 1510   RBC 3.99 09/07/2016 1510   HGB 12.7 09/07/2016 1510   HCT 36.8 09/07/2016 1510   PLT 304 09/07/2016 1510   MCV 92.2 09/07/2016 1510   MCV 95.0 07/17/2012 1257   MCH 31.8 09/07/2016 1510   MCHC 34.5 09/07/2016 1510   RDW 12.8 09/07/2016 1510   LYMPHSABS 2.1 02/27/2013 1501   MONOABS 0.8 02/27/2013 1501   EOSABS 0.1 02/27/2013 1501   BASOSABS 0.0 02/27/2013 1501    A/P: 51yo G2P2 who presents for LAVH, BS due to AUB, uterine fibroids and iron def. Anemia -NPO -LR @ 125cc/hr -SCDs to OR -Ancef 2g IV -Benefit and risks including but not limited to risk of bleeding, infection and injury to surrounding organs. reviewed recovery and activity following surgery. Questions and concerns were addressed with patient and she wishes to proceed with scheduled surgery.   Janyth Pupa, DO [phone] (pager) [phone] (office)

## 2016-09-19 NOTE — Anesthesia Preprocedure Evaluation (Addendum)
Anesthesia Evaluation  Patient identified by MRN, date of birth, ID band Patient awake    Reviewed: Allergy & Precautions, NPO status , Patient's Chart, lab work & pertinent test results  Airway Mallampati: II  TM Distance: >3 FB Neck ROM: Full    Dental  (+) Dental Advisory Given   Pulmonary neg pulmonary ROS,    breath sounds clear to auscultation       Cardiovascular negative cardio ROS   Rhythm:Regular Rate:Normal     Neuro/Psych Depression  Neuromuscular disease (Hx of Bells palsy)    GI/Hepatic negative GI ROS, Neg liver ROS,   Endo/Other  negative endocrine ROS  Renal/GU negative Renal ROS     Musculoskeletal   Abdominal   Peds  Hematology negative hematology ROS (+)   Anesthesia Other Findings   Reproductive/Obstetrics                            Lab Results  Component Value Date   WBC 6.9 09/07/2016   HGB 12.7 09/07/2016   HCT 36.8 09/07/2016   MCV 92.2 09/07/2016   PLT 304 09/07/2016   Lab Results  Component Value Date   CREATININE 0.61 09/07/2016   BUN 10 09/07/2016   NA 135 09/07/2016   K 3.7 09/07/2016   CL 99 (L) 09/07/2016   CO2 29 09/07/2016    Anesthesia Physical Anesthesia Plan  ASA: I  Anesthesia Plan: General   Post-op Pain Management:    Induction: Intravenous  Airway Management Planned: Oral ETT  Additional Equipment:   Intra-op Plan:   Post-operative Plan: Extubation in OR  Informed Consent: I have reviewed the patients History and Physical, chart, labs and discussed the procedure including the risks, benefits and alternatives for the proposed anesthesia with the patient or authorized representative who has indicated his/her understanding and acceptance.   Dental advisory given  Plan Discussed with:   Anesthesia Plan Comments:        Anesthesia Quick Evaluation

## 2016-09-20 ENCOUNTER — Observation Stay (HOSPITAL_COMMUNITY)
Admission: RE | Admit: 2016-09-20 | Discharge: 2016-09-21 | Disposition: A | Discharge status: Home / Self Care | Payer: No Typology Code available for payment source | Source: Ambulatory Visit | Attending: Obstetrics & Gynecology | Admitting: Obstetrics & Gynecology

## 2016-09-20 ENCOUNTER — Ambulatory Visit (HOSPITAL_COMMUNITY): Payer: No Typology Code available for payment source | Admitting: Anesthesiology

## 2016-09-20 ENCOUNTER — Encounter (HOSPITAL_COMMUNITY)
Admission: RE | Disposition: A | Discharge status: Home / Self Care | Payer: Self-pay | Source: Ambulatory Visit | Attending: Obstetrics & Gynecology

## 2016-09-20 ENCOUNTER — Encounter (HOSPITAL_COMMUNITY): Payer: Self-pay

## 2016-09-20 DIAGNOSIS — Z8669 Personal history of other diseases of the nervous system and sense organs: Secondary | ICD-10-CM | POA: Diagnosis not present

## 2016-09-20 DIAGNOSIS — N9971 Accidental puncture and laceration of a genitourinary system organ or structure during a genitourinary system procedure: Secondary | ICD-10-CM | POA: Diagnosis not present

## 2016-09-20 DIAGNOSIS — N800 Endometriosis of uterus: Principal | ICD-10-CM | POA: Insufficient documentation

## 2016-09-20 DIAGNOSIS — N939 Abnormal uterine and vaginal bleeding, unspecified: Secondary | ICD-10-CM | POA: Diagnosis present

## 2016-09-20 DIAGNOSIS — Z8249 Family history of ischemic heart disease and other diseases of the circulatory system: Secondary | ICD-10-CM | POA: Insufficient documentation

## 2016-09-20 DIAGNOSIS — D259 Leiomyoma of uterus, unspecified: Secondary | ICD-10-CM | POA: Diagnosis not present

## 2016-09-20 DIAGNOSIS — Z79899 Other long term (current) drug therapy: Secondary | ICD-10-CM | POA: Insufficient documentation

## 2016-09-20 DIAGNOSIS — Z888 Allergy status to other drugs, medicaments and biological substances status: Secondary | ICD-10-CM | POA: Insufficient documentation

## 2016-09-20 DIAGNOSIS — N938 Other specified abnormal uterine and vaginal bleeding: Secondary | ICD-10-CM | POA: Diagnosis present

## 2016-09-20 HISTORY — PX: BLADDER REPAIR: SHX6721

## 2016-09-20 HISTORY — PX: LAPAROSCOPIC VAGINAL HYSTERECTOMY WITH SALPINGECTOMY: SHX6680

## 2016-09-20 LAB — PREGNANCY, URINE: Preg Test, Ur: NEGATIVE

## 2016-09-20 SURGERY — HYSTERECTOMY, VAGINAL, LAPAROSCOPY-ASSISTED, WITH SALPINGECTOMY
Anesthesia: General | Site: Bladder | Laterality: Bilateral

## 2016-09-20 MED ORDER — FENTANYL CITRATE (PF) 100 MCG/2ML IJ SOLN
INTRAMUSCULAR | Status: DC | PRN
  Administered 2016-09-20 (×5): 50 ug via INTRAVENOUS

## 2016-09-20 MED ORDER — LACTATED RINGERS IR SOLN
Status: DC | PRN
  Administered 2016-09-20: 3000 mL

## 2016-09-20 MED ORDER — PROPOFOL 10 MG/ML IV BOLUS
INTRAVENOUS | Status: DC | PRN
  Administered 2016-09-20: 140 mg via INTRAVENOUS

## 2016-09-20 MED ORDER — LACTATED RINGERS IV SOLN
INTRAVENOUS | Status: DC
  Administered 2016-09-20: 23:00:00 via INTRAVENOUS

## 2016-09-20 MED ORDER — ACETAMINOPHEN 10 MG/ML IV SOLN
1000.0000 mg | Freq: Once | INTRAVENOUS | Status: DC
  Filled 2016-09-20: qty 100

## 2016-09-20 MED ORDER — ONDANSETRON HCL 4 MG PO TABS: 4.0000 mg | ORAL_TABLET | Freq: Four times a day (QID) | ORAL | Status: DC | PRN

## 2016-09-20 MED ORDER — BUPIVACAINE HCL (PF) 0.25 % IJ SOLN
INTRAMUSCULAR | Status: AC
  Filled 2016-09-20: qty 30

## 2016-09-20 MED ORDER — KETOROLAC TROMETHAMINE 30 MG/ML IJ SOLN
INTRAMUSCULAR | Status: AC
  Filled 2016-09-20: qty 1

## 2016-09-20 MED ORDER — ROCURONIUM BROMIDE 100 MG/10ML IV SOLN
INTRAVENOUS | Status: AC
  Filled 2016-09-20: qty 1

## 2016-09-20 MED ORDER — SUGAMMADEX SODIUM 200 MG/2ML IV SOLN
INTRAVENOUS | Status: AC
  Filled 2016-09-20: qty 2

## 2016-09-20 MED ORDER — PHENYLEPHRINE HCL 10 MG/ML IJ SOLN
INTRAMUSCULAR | Status: DC | PRN
  Administered 2016-09-20 (×6): 40 ug via INTRAVENOUS
  Administered 2016-09-20: 80 ug via INTRAVENOUS
  Administered 2016-09-20: 40 ug via INTRAVENOUS

## 2016-09-20 MED ORDER — MIRABEGRON ER 25 MG PO TB24
50.0000 mg | ORAL_TABLET | Freq: Every day | ORAL | Status: DC
  Administered 2016-09-20: 50 mg via ORAL
  Filled 2016-09-20 (×2): qty 2

## 2016-09-20 MED ORDER — FENTANYL CITRATE (PF) 250 MCG/5ML IJ SOLN
INTRAMUSCULAR | Status: AC
  Filled 2016-09-20: qty 5

## 2016-09-20 MED ORDER — MIDAZOLAM HCL 2 MG/2ML IJ SOLN
INTRAMUSCULAR | Status: DC | PRN
  Administered 2016-09-20: 2 mg via INTRAVENOUS

## 2016-09-20 MED ORDER — LACTATED RINGERS IV SOLN
INTRAVENOUS | Status: DC
  Administered 2016-09-20 (×3): via INTRAVENOUS

## 2016-09-20 MED ORDER — ROCURONIUM BROMIDE 100 MG/10ML IV SOLN
INTRAVENOUS | Status: DC | PRN
  Administered 2016-09-20: 10 mg via INTRAVENOUS
  Administered 2016-09-20: 40 mg via INTRAVENOUS
  Administered 2016-09-20 (×3): 10 mg via INTRAVENOUS

## 2016-09-20 MED ORDER — HYDROMORPHONE HCL 1 MG/ML IJ SOLN: 0.2500 mg | INTRAMUSCULAR | Status: DC | PRN

## 2016-09-20 MED ORDER — GLYCOPYRROLATE 0.2 MG/ML IJ SOLN
INTRAMUSCULAR | Status: DC | PRN
  Administered 2016-09-20 (×2): 0.1 mg via INTRAVENOUS

## 2016-09-20 MED ORDER — IBUPROFEN 600 MG PO TABS: 600.0000 mg | ORAL_TABLET | Freq: Four times a day (QID) | ORAL | Status: DC | PRN

## 2016-09-20 MED ORDER — KETOROLAC TROMETHAMINE 30 MG/ML IJ SOLN: 30.0000 mg | Freq: Four times a day (QID) | INTRAMUSCULAR | Status: DC

## 2016-09-20 MED ORDER — ONDANSETRON HCL 4 MG/2ML IJ SOLN
INTRAMUSCULAR | Status: AC
  Filled 2016-09-20: qty 2

## 2016-09-20 MED ORDER — LIDOCAINE HCL (CARDIAC) 20 MG/ML IV SOLN
INTRAVENOUS | Status: AC
  Filled 2016-09-20: qty 5

## 2016-09-20 MED ORDER — PHENYLEPHRINE 40 MCG/ML (10ML) SYRINGE FOR IV PUSH (FOR BLOOD PRESSURE SUPPORT)
PREFILLED_SYRINGE | INTRAVENOUS | Status: AC
  Filled 2016-09-20: qty 10

## 2016-09-20 MED ORDER — SCOPOLAMINE 1 MG/3DAYS TD PT72
1.0000 | MEDICATED_PATCH | Freq: Once | TRANSDERMAL | Status: DC
  Administered 2016-09-20: 1.5 mg via TRANSDERMAL

## 2016-09-20 MED ORDER — DEXAMETHASONE SODIUM PHOSPHATE 4 MG/ML IJ SOLN
INTRAMUSCULAR | Status: AC
  Filled 2016-09-20: qty 1

## 2016-09-20 MED ORDER — SIMETHICONE 80 MG PO CHEW: 80.0000 mg | CHEWABLE_TABLET | Freq: Four times a day (QID) | ORAL | Status: DC | PRN

## 2016-09-20 MED ORDER — ONDANSETRON HCL 4 MG/2ML IJ SOLN
INTRAMUSCULAR | Status: DC | PRN
  Administered 2016-09-20: 4 mg via INTRAVENOUS

## 2016-09-20 MED ORDER — MENTHOL 3 MG MT LOZG: 1.0000 | LOZENGE | OROMUCOSAL | Status: DC | PRN

## 2016-09-20 MED ORDER — LIDOCAINE HCL 1 % IJ SOLN
INTRAMUSCULAR | Status: AC
  Filled 2016-09-20: qty 20

## 2016-09-20 MED ORDER — LIDOCAINE-EPINEPHRINE (PF) 1 %-1:200000 IJ SOLN
INTRAMUSCULAR | Status: AC
  Filled 2016-09-20: qty 30

## 2016-09-20 MED ORDER — SODIUM CHLORIDE 0.9 % IJ SOLN
INTRAMUSCULAR | Status: AC
  Filled 2016-09-20: qty 10

## 2016-09-20 MED ORDER — MIRABEGRON ER 50 MG PO TB24: 50.0000 mg | ORAL_TABLET | Freq: Every day | ORAL | 0 refills | Status: DC

## 2016-09-20 MED ORDER — LIDOCAINE-EPINEPHRINE (PF) 1 %-1:200000 IJ SOLN
INTRAMUSCULAR | Status: DC | PRN
  Administered 2016-09-20: 18 mL

## 2016-09-20 MED ORDER — OXYCODONE-ACETAMINOPHEN 5-325 MG PO TABS: 1.0000 | ORAL_TABLET | ORAL | Status: DC | PRN

## 2016-09-20 MED ORDER — BUPIVACAINE HCL (PF) 0.25 % IJ SOLN
INTRAMUSCULAR | Status: DC | PRN
  Administered 2016-09-20: 25 mL

## 2016-09-20 MED ORDER — DOCUSATE SODIUM 100 MG PO CAPS
100.0000 mg | ORAL_CAPSULE | Freq: Two times a day (BID) | ORAL | Status: DC
  Administered 2016-09-20: 100 mg via ORAL
  Filled 2016-09-20: qty 1

## 2016-09-20 MED ORDER — ACETAMINOPHEN 10 MG/ML IV SOLN
INTRAVENOUS | Status: DC | PRN
  Administered 2016-09-20: 1000 mg via INTRAVENOUS

## 2016-09-20 MED ORDER — CEFAZOLIN SODIUM-DEXTROSE 2-4 GM/100ML-% IV SOLN
2.0000 g | Freq: Once | INTRAVENOUS | Status: AC
  Administered 2016-09-20: 2 g via INTRAVENOUS

## 2016-09-20 MED ORDER — KETOROLAC TROMETHAMINE 30 MG/ML IJ SOLN
30.0000 mg | Freq: Four times a day (QID) | INTRAMUSCULAR | Status: DC
  Administered 2016-09-20 – 2016-09-21 (×2): 30 mg via INTRAVENOUS
  Filled 2016-09-20 (×2): qty 1

## 2016-09-20 MED ORDER — DEXAMETHASONE SODIUM PHOSPHATE 4 MG/ML IJ SOLN
INTRAMUSCULAR | Status: DC | PRN
  Administered 2016-09-20: 4 mg via INTRAVENOUS

## 2016-09-20 MED ORDER — ONDANSETRON HCL 4 MG/2ML IJ SOLN: 4.0000 mg | Freq: Four times a day (QID) | INTRAMUSCULAR | Status: DC | PRN

## 2016-09-20 MED ORDER — MIDAZOLAM HCL 2 MG/2ML IJ SOLN
INTRAMUSCULAR | Status: AC
  Filled 2016-09-20: qty 2

## 2016-09-20 MED ORDER — SUGAMMADEX SODIUM 200 MG/2ML IV SOLN
INTRAVENOUS | Status: DC | PRN
  Administered 2016-09-20: 110 mg via INTRAVENOUS

## 2016-09-20 MED ORDER — LACTATED RINGERS IV SOLN: INTRAVENOUS | Status: DC

## 2016-09-20 MED ORDER — GLYCOPYRROLATE 0.2 MG/ML IJ SOLN
INTRAMUSCULAR | Status: AC
  Filled 2016-09-20: qty 1

## 2016-09-20 MED ORDER — SCOPOLAMINE 1 MG/3DAYS TD PT72
MEDICATED_PATCH | TRANSDERMAL | Status: AC
  Administered 2016-09-20: 1.5 mg via TRANSDERMAL
  Filled 2016-09-20: qty 1

## 2016-09-20 MED ORDER — PROMETHAZINE HCL 25 MG/ML IJ SOLN: 6.2500 mg | INTRAMUSCULAR | Status: DC | PRN

## 2016-09-20 MED ORDER — OXYBUTYNIN CHLORIDE 5 MG PO TABS: 5.0000 mg | ORAL_TABLET | Freq: Three times a day (TID) | ORAL | 0 refills | Status: DC | PRN

## 2016-09-20 MED ORDER — PROPOFOL 10 MG/ML IV BOLUS
INTRAVENOUS | Status: AC
  Filled 2016-09-20: qty 20

## 2016-09-20 MED ORDER — LIDOCAINE HCL (CARDIAC) 20 MG/ML IV SOLN
INTRAVENOUS | Status: DC | PRN
  Administered 2016-09-20: 50 mg via INTRAVENOUS

## 2016-09-20 SURGICAL SUPPLY — 49 items
AGENT HMST MTR 8 SURGIFLO (HEMOSTASIS)
APL SRG 38 LTWT LNG FL B (MISCELLANEOUS) ×2
APPLICATOR ARISTA FLEXITIP XL (MISCELLANEOUS) ×3 IMPLANT
CABLE HIGH FREQUENCY MONO STRZ (ELECTRODE) IMPLANT
CATH INTERMIT  6FR 70CM (CATHETERS) ×6 IMPLANT
CLOTH BEACON ORANGE TIMEOUT ST (SAFETY) ×3 IMPLANT
DERMABOND ADVANCED (GAUZE/BANDAGES/DRESSINGS) ×1
DERMABOND ADVANCED .7 DNX12 (GAUZE/BANDAGES/DRESSINGS) ×2 IMPLANT
DRSG OPSITE POSTOP 3X4 (GAUZE/BANDAGES/DRESSINGS) ×3 IMPLANT
DURAPREP 26ML APPLICATOR (WOUND CARE) ×6 IMPLANT
GAUZE PACKING IODOFORM 2 (PACKING) ×3 IMPLANT
GLOVE BIOGEL PI IND STRL 6.5 (GLOVE) ×6 IMPLANT
GLOVE BIOGEL PI IND STRL 7.0 (GLOVE) ×4 IMPLANT
GLOVE BIOGEL PI INDICATOR 6.5 (GLOVE) ×3
GLOVE BIOGEL PI INDICATOR 7.0 (GLOVE) ×2
GLOVE ECLIPSE 6.5 STRL STRAW (GLOVE) ×6 IMPLANT
GUIDEWIRE STR DUAL SENSOR (WIRE) ×3 IMPLANT
HEMOSTAT ARISTA ABSORB 3G PWDR (MISCELLANEOUS) ×3 IMPLANT
LEGGING LITHOTOMY PAIR STRL (DRAPES) ×3 IMPLANT
NEEDLE INSUFFLATION 120MM (ENDOMECHANICALS) ×3 IMPLANT
PACK LAVH (CUSTOM PROCEDURE TRAY) ×3 IMPLANT
PACK ROBOTIC GOWN (GOWN DISPOSABLE) ×3 IMPLANT
PACK TRENDGUARD 450 HYBRID PRO (MISCELLANEOUS) ×2 IMPLANT
PACK TRENDGUARD 600 HYBRD PROC (MISCELLANEOUS) IMPLANT
PAD OB MATERNITY 4.3X12.25 (PERSONAL CARE ITEMS) ×3 IMPLANT
PROTECTOR NERVE ULNAR (MISCELLANEOUS) ×6 IMPLANT
SCISSORS LAP 5X35 DISP (ENDOMECHANICALS) IMPLANT
SET CYSTO W/LG BORE CLAMP LF (SET/KITS/TRAYS/PACK) ×3 IMPLANT
SET IRRIG TUBING LAPAROSCOPIC (IRRIGATION / IRRIGATOR) ×3 IMPLANT
SHEARS FOC LG CVD HARMONIC 17C (MISCELLANEOUS) ×3 IMPLANT
SHEARS HARMONIC ACE PLUS 36CM (ENDOMECHANICALS) ×3 IMPLANT
SLEEVE XCEL OPT CAN 5 100 (ENDOMECHANICALS) ×6 IMPLANT
SPOGE SURGIFLO 8M (HEMOSTASIS)
SPONGE SURGIFLO 8M (HEMOSTASIS) IMPLANT
SUT MON AB 4-0 PS1 27 (SUTURE) IMPLANT
SUT VIC AB 0 CT1 18XCR BRD8 (SUTURE) ×2 IMPLANT
SUT VIC AB 0 CT1 36 (SUTURE) ×9 IMPLANT
SUT VIC AB 0 CT1 8-18 (SUTURE) ×3
SUT VIC AB 2-0 SH 27 (SUTURE) ×9
SUT VIC AB 2-0 SH 27XBRD (SUTURE) ×6 IMPLANT
SUT VICRYL 0 TIES 12 18 (SUTURE) ×3 IMPLANT
TOWEL OR 17X24 6PK STRL BLUE (TOWEL DISPOSABLE) ×6 IMPLANT
TRAY FOLEY CATH SILVER 14FR (SET/KITS/TRAYS/PACK) ×3 IMPLANT
TRAY FOLEY CATH SILVER 16FR (SET/KITS/TRAYS/PACK) ×3 IMPLANT
TRENDGUARD 450 HYBRID PRO PACK (MISCELLANEOUS) ×3
TRENDGUARD 600 HYBRID PROC PK (MISCELLANEOUS)
TROCAR XCEL NON-BLD 5MMX100MML (ENDOMECHANICALS) ×3 IMPLANT
WARMER LAPAROSCOPE (MISCELLANEOUS) ×3 IMPLANT
YANKAUER SUCT BULB TIP NO VENT (SUCTIONS) ×3 IMPLANT

## 2016-09-20 NOTE — Op Note (Signed)
   Preoperative diagnosis:  1. Bladder injury  Postoperative diagnosis: 1. Bladder injury  Procedure(s): 1. Repair of bladder injury 2.  Cystoscopy 3.  Cannulization of bilateral ureters  Surgeon: Dr. Roxy Horseman, Jr  Anesthesia: General  Complications: None  EBL: Minimal  Indication:  Suzanne Marks is a 51 year old female who was undergoing a laparoscopic-assisted transvaginal hysterectomy this morning.  I was called for an intraoperative consultation by Dr. Janyth Pupa for a recognize bladder injury during this procedure to provide urologic evaluation and treatment of her bladder injury.  Description of procedure:  At the time I was called to the operating room, Dr. Nelda Marseille had just about completed the vaginal hysterectomy.  Once the uterus had been removed, I was able to examine the operative field transvaginally.  There appeared to be a posterior bladder injury was recognized and Allis clamps were placed on either side of the mucosal edges.  The patient's indwelling Foley catheter was removed I performed cystoscopy.  The bladder was able to be filled with saline and examined.  The opening posteriorly was able to be identified and appeared to be beyond the trigone of the bladder and likely not involving the ureteral orifices or ureters.  To confirm this, I inserted a 0.38 sensor guidewire into the left ureter which passed without difficulty.  I then inserted a 6 French ureteral catheter over the wire which also passed without difficulty.  This was left in place.  I then removed and reinserted the cystoscope.  I performed a similar procedure on the right side with a sensor guidewire placed up the right ureter without problems.  A 6 French ureteral catheter was then also placed up the right ureter over the wire again without difficulty.  The cystoscope was then removed.  I then reexamined the operative field transvaginally and was felt that the ureters were not near the opening of the  bladder.  I then performed a 2 layer closure of the bladder with a 3-0 Vicryl mucosal layer followed by an imbricating 2-0 Vicryl detrusor layer.  I then again filled the bladder with saline via the cystoscope and the repair was noted to be watertight.  The cystoscope was then removed and a new 13 French Foley catheter was inserted.  There was noted be a flap of peritoneum which was further developed.  This was then sewn over the bladder repair with the goal being to help reduce the risk of vesicovaginal fistula.  At this point, the case was turned back over to Dr. Nelda Marseille.  It was recommended that she closed the vaginal cuff transversely (rather than parallel) to the horizontal bladder closure in an attempt to also reduce the risk of vesicovaginal fistula.  The patient tolerated this portion of the procedure well.  I will plan to leave the indwelling Foley catheter upon discharge with plans for her to return in approximately 10 days for a cystogram and probable catheter removal at that time.

## 2016-09-20 NOTE — Op Note (Signed)
Preoperative Diagnosis: Abnormal uterine bleeding, uterine fibroids Postoperative Diagnosis: same  Procedure: Laparoscopy Assisted Vaginal Hysterectomy, Bilateral salpingectomy, Repair of bladder injury Complications: bladder injury Surgeon: Dr. Janyth Pupa  Assistant: Dr. Cyndia Skeeters and Dr. Alinda Money Anesthetic: General  IVF: 2000cc  EBL: 300cc  UOP: 200cc  Specimens: 1)Left fallopian tube, uterus with cervix   Findings: No free fluid or omental studding appreciated. Normal appearing liver, appendix, gallbladder and bowel. Adhesions and suspected endometriosis noted at bilateral adnexal.  Both ovaries with adhesions to the ovaries  Procedure: The patient was taken to the operating room where general anesthesia was found to be adequate. The patient's abdomen was prepped with ChloraPrep. The perineum and vagina were prepped with multiple layers of Betadine. The patient was sterilely draped. A Foley catheter was placed in the bladder. A Hulka tenaculum was placed inside the uterus. Gown and gloves were changed and attention was turned to the abdomen. An incision was made in the supraumbilical area and the Veress needle was inserted into the abdominal cavity without difficulty. Proper placement was confirmed using the saline drop test and opening pressure was 32mmHg. A pneumoperitoneum was obtained. The laparoscopic trocar and the laparoscope were placed under direct visualization. Two additional ports were placed in the right and left lower quadrants. Each area was injected with quarter percent Marcaine. A small incision was made and a 5 mm trocar was inserted into the abdominal cavity under direct visualization. An abdominal scan was performed with the findings noted above.    Attention was turned to the left adnexa and the ureter was idenitifed. The left round ligament was ligated.  Using the harmonic the adhesions between the ovary and fallopian tube were taken down.  Care was taken to avoid the  IP.  The utero-ovarian ligament was ligated and adhesions between the left ovary and uterus were taken down with the Harmonic.  Hemostasis was achieved with the Kleppinger.  The anterior leaf of the broad ligament was incised and dissection of the vesicouterine flap was performed.  Attention was turned to the right adnexa. Due to the dense adhesions, the right fallopian tube was not able to be removed due to concern for ovarian preservation.  In a similar fashion, the right ureter was identified.  The right ovary was dissected off the uterus using the harmonic.  The right fallopian tube and utero-ovarian ligatment were ligated.  The right anterior leaf of the broad ligament was dissected and carried down to complete the vesicouterine flap.  Hemostasis was achieved and the case then proceeded to the vaginal portion.  The Hulka was removed and a weighted speculum was placed in the posterior vagina. The cervix was injected with half percent Marcaine with epinephrine. The cervix was then circumferentially incised with the bovie.  The posterior cul-de-sac was entered sharply. Attention was turned anteriorly, sharp dissection of the pubovesical cervical fascia was performed and entry into the anterior cul-de-sac was performed sharply.  However, upon further investigation of this incision, it became evident that the incision was into the bladder.  At that time, urology was notified for immediate consultation.  Care was then taken to identify the peritoneum and the anterior cul-de-sac was entered.  A heany clamp was placed over the uterosacral ligaments bilaterally. These were transected and suture ligated with 0 vicryl.  The cardinal ligaments were then clamped bilaterally and transected and suture ligated in a similar fashion. The uterine arteries and broad ligament were serially clamped with heany clamps, transected and suture ligated bilaterally. The  uterus was removed from the operative field and sent to pathology.  Hemostasis was confirmed.  At that time, Dr. Alinda Money was present for further surgical management and repair of the bladder injury.  Please see separate operative report.  Once the repair was completed, a foley catheter was reinserted.   Inspection of the pedicles was performed and hemostasis was noted.  A modified McCall's culdoplasty was performed.  The vaginal cuff was closed in a running locked fashion using 0 vicryl in a vertical fashion. Vaginal packing was placed. Gown and gloves were changed and attention was turned to the abdomen. The pneumoperitoneum was reestablished. The pelvis was irrigated and inspected, hemostasis was noted.  The left fallopian tube was removed laparoscopically as it was not identified during the vaginal portion of the procedure.  Arista was placed.  The lateral 5 mm trocars were removed under direct visualization. The pneumoperitoneum was allowed to escape. The subumbilical trocar was removed. Dermabond was placed over the three trocar sites. The patient tolerated her procedure well. She was awakened from her anesthetic without difficulty and then transported to the recovery room in stable condition. Sponge, needle, and instrument counts were correct. Dr. Simona Huh assisted in completion of this surgery due to the complexity of this case.  Janyth Pupa, DO  [phone] (pager)  [phone] (office)

## 2016-09-20 NOTE — Anesthesia Postprocedure Evaluation (Signed)
Anesthesia Post Note  Patient: Suzanne Marks  Procedure(s) Performed: Procedure(s) (LRB): LAPAROSCOPIC ASSISTED VAGINAL HYSTERECTOMY WITH SALPINGECTOMY (Bilateral) BLADDER REPAIR  Patient location during evaluation: PACU Anesthesia Type: General Level of consciousness: awake and alert Pain management: pain level controlled Vital Signs Assessment: post-procedure vital signs reviewed and stable Respiratory status: spontaneous breathing, nonlabored ventilation, respiratory function stable and patient connected to nasal cannula oxygen Cardiovascular status: blood pressure returned to baseline and stable Postop Assessment: no signs of nausea or vomiting Anesthetic complications: no        Last Vitals:  Vitals:   09/20/16 1215 09/20/16 1230  BP: 125/66 120/64  Pulse: (!) 55 (!) 56  Resp: 16 18  Temp: 36.8 C 37.3 C    Last Pain:  Vitals:   09/20/16 1230  TempSrc:   PainSc: 0-No pain   Pain Goal: Patients Stated Pain Goal: 6 (09/20/16 1053)               Tiajuana Amass

## 2016-09-20 NOTE — Addendum Note (Signed)
Addendum  created 09/20/16 1558 by Hewitt Blade, CRNA   Sign clinical note

## 2016-09-20 NOTE — Progress Notes (Signed)
Patient ID: SUMA ROCCAFORTE, female   DOB: June 23, 1966, 51 y.o.   MRN: [id number]  Day of Surgery Subjective: Pt doing well s/p hysterectomy and bladder repair. Complains of bladder spasms.  Objective: Vital signs in last 24 hours: Temp:  [97.4 F (36.3 C)-99.8 F (37.7 C)] 99.8 F (37.7 C) (02/28 1759) Pulse Rate:  [50-80] 59 (02/28 1759) Resp:  [12-18] 18 (02/28 1759) BP: (115-135)/(63-80) 123/63 (02/28 1759) SpO2:  [95 %-100 %] 100 % (02/28 1759) Weight:  [54.9 kg (121 lb)] 54.9 kg (121 lb) (02/28 1700)  Physical Exam:  General: Alert and oriented GU: Urine draining well, pink.   Assessment/Plan: Bladder injury status post repair.  I explained to Nira Conn and her husband her situation in detail.  We discussed the intraoperative recognition of a bladder injury and the fact that this has been repaired.  We did discuss the need to have an indwelling urethral catheter for approximately 10 days to facilitate healing and to reduce the risk of vesicovaginal fistula formation.  She expressed her understanding.  We also discussed her current suprapubic pressure and bladder spasms.  She will be prescribed Myrbetriq 50 mg tonight.  I have provided her a prescription for both Myrbetriq and oxybutynin upon discharge.  She has been instructed on proper use and potential side effects of these medications.  She will plan to follow up with me in approximately 10 days to review her cystogram and to consider catheter removal.  I will plan to arrange this as an outpatient.  All questions were answered to her stated satisfaction.   LOS: 1 day   Zemirah Krasinski,LES 09/20/2016, 7:43 PM

## 2016-09-20 NOTE — Anesthesia Procedure Notes (Addendum)
Procedure Name: Intubation Date/Time: 09/20/2016 7:22 AM Performed by: Raenette Rover Pre-anesthesia Checklist: Patient identified, Emergency Drugs available, Suction available and Patient being monitored Patient Re-evaluated:Patient Re-evaluated prior to inductionOxygen Delivery Method: Circle system utilized Preoxygenation: Pre-oxygenation with 100% oxygen Intubation Type: IV induction Ventilation: Mask ventilation without difficulty Laryngoscope Size: Miller and 2 Grade View: Grade I Tube type: Oral Tube size: 7.0 mm Number of attempts: 1 Airway Equipment and Method: Stylet Placement Confirmation: ETT inserted through vocal cords under direct vision,  positive ETCO2,  CO2 detector and breath sounds checked- equal and bilateral Secured at: 22 cm Tube secured with: Tape Dental Injury: Teeth and Oropharynx as per pre-operative assessment

## 2016-09-20 NOTE — Interval H&P Note (Signed)
History and Physical Interval Note:  09/20/2016 6:47 AM  Suzanne Marks  has presented today for surgery, with the diagnosis of N93.9 AUB  The various methods of treatment have been discussed with the patient and family. After consideration of risks, benefits and other options for treatment, the patient has consented to  Procedure(s): LAPAROSCOPIC ASSISTED VAGINAL HYSTERECTOMY WITH SALPINGECTOMY (Bilateral) as a surgical intervention .  The patient's history has been reviewed, patient examined, no change in status, stable for surgery.  I have reviewed the patient's chart and labs.  Questions were answered to the patient's satisfaction.     Janyth Pupa, M

## 2016-09-20 NOTE — Transfer of Care (Signed)
Immediate Anesthesia Transfer of Care Note  Patient: Suzanne Marks  Procedure(s) Performed: Procedure(s): LAPAROSCOPIC ASSISTED VAGINAL HYSTERECTOMY WITH SALPINGECTOMY (Bilateral) BLADDER REPAIR  Patient Location: PACU  Anesthesia Type:General  Level of Consciousness: awake, alert , oriented and patient cooperative  Airway & Oxygen Therapy: Patient Spontanous Breathing and Patient connected to nasal cannula oxygen  Post-op Assessment: Report given to RN and Post -op Vital signs reviewed and stable  Post vital signs: Reviewed and stable SB at 59, 100% Spo2 on 2L [place], 135/66  Last Vitals:  Vitals:   09/20/16 0611  BP: 134/77  Pulse: 80  Resp: 18  Temp: 36.7 C    Last Pain:  Vitals:   09/20/16 0611  TempSrc: Oral      Patients Stated Pain Goal: 6 ([id number])  Complications: No apparent anesthesia complications

## 2016-09-20 NOTE — Anesthesia Postprocedure Evaluation (Signed)
Anesthesia Post Note  Patient: Fynleigh Noel Vandegrift  Procedure(s) Performed: Procedure(s) (LRB): LAPAROSCOPIC ASSISTED VAGINAL HYSTERECTOMY WITH SALPINGECTOMY (Bilateral) BLADDER REPAIR  Patient location during evaluation: [facility] Anesthesia Type: General Level of consciousness: awake and alert and oriented Pain management: pain level controlled Vital Signs Assessment: post-procedure vital signs reviewed and stable Respiratory status: spontaneous breathing and nonlabored ventilation Cardiovascular status: stable Postop Assessment: adequate PO intake and no signs of nausea or vomiting Anesthetic complications: no        Last Vitals:  Vitals:   09/20/16 1230 09/20/16 1330  BP: 120/64 128/70  Pulse: (!) 56 62  Resp: 18 16  Temp: 37.3 C 36.3 C    Last Pain:  Vitals:   09/20/16 1330  TempSrc: Oral  PainSc:    Pain Goal: Patients Stated Pain Goal: 3 (09/20/16 1230)               Keanu Frickey Hristova

## 2016-09-21 ENCOUNTER — Other Ambulatory Visit (HOSPITAL_COMMUNITY): Payer: Self-pay | Admitting: Urology

## 2016-09-21 DIAGNOSIS — S3729XS Other injury of bladder, sequela: Secondary | ICD-10-CM

## 2016-09-21 DIAGNOSIS — N800 Endometriosis of uterus: Secondary | ICD-10-CM | POA: Diagnosis not present

## 2016-09-21 LAB — BASIC METABOLIC PANEL
Anion gap: 6 (ref 5–15)
BUN: 6 mg/dL (ref 6–20)
CHLORIDE: 100 mmol/L — AB (ref 101–111)
CO2: 27 mmol/L (ref 22–32)
Calcium: 8.5 mg/dL — ABNORMAL LOW (ref 8.9–10.3)
Creatinine, Ser: 0.66 mg/dL (ref 0.44–1.00)
GFR calc Af Amer: >60 mL/min (ref >60)
GFR calc non Af Amer: >60 mL/min (ref >60)
GLUCOSE: 89 mg/dL (ref 65–99)
POTASSIUM: 3.7 mmol/L (ref 3.5–5.1)
Sodium: 133 mmol/L — ABNORMAL LOW (ref 135–145)

## 2016-09-21 LAB — CBC
HCT: 32.9 % — ABNORMAL LOW (ref 36.0–46.0)
HEMOGLOBIN: 11.8 g/dL — AB (ref 12.0–15.0)
MCH: 32.2 pg (ref 26.0–34.0)
MCHC: 35.9 g/dL (ref 30.0–36.0)
MCV: 89.9 fL (ref 78.0–100.0)
Platelets: 274 10*3/uL (ref 150–400)
RBC: 3.66 MIL/uL — AB (ref 3.87–5.11)
RDW: 12.8 % (ref 11.5–15.5)
WBC: 13.6 10*3/uL — ABNORMAL HIGH (ref 4.0–10.5)

## 2016-09-21 MED ORDER — OXYCODONE-ACETAMINOPHEN 5-325 MG PO TABS: 1.0000 | ORAL_TABLET | Freq: Four times a day (QID) | ORAL | 0 refills | Status: DC | PRN

## 2016-09-21 MED ORDER — IBUPROFEN 600 MG PO TABS: 600.0000 mg | ORAL_TABLET | Freq: Four times a day (QID) | ORAL | 0 refills | Status: DC | PRN

## 2016-09-21 MED ORDER — DOCUSATE SODIUM 100 MG PO CAPS: 100.0000 mg | ORAL_CAPSULE | Freq: Two times a day (BID) | ORAL | 0 refills | Status: DC

## 2016-09-21 NOTE — Progress Notes (Addendum)
Pt out in wheelchair  Teaching complete cath care and leg bag care and instructions reviewed with pt

## 2016-09-21 NOTE — Progress Notes (Signed)
Postoperative Note Day # 1  S:  Patient resting comfortable in bed.  Pain controlled.  Tolerating general diet. No flatus, no BM.  Minimal bleeding.  Ambulating without difficulty.  She denies n/v/f/c, SOB, or CP. Foley in place with clear urine in tube.  O: Temp:  [97.4 F (36.3 C)-99.8 F (37.7 C)] 98.6 F (37 C) (03/01 0455) Pulse Rate:  [50-78] 76 (03/01 0455) Resp:  [12-18] 18 (03/01 0455) BP: (96-135)/(53-80) 107/53 (03/01 0455) SpO2:  [95 %-100 %] 99 % (03/01 0455) Weight:  [121 lb (54.9 kg)] 121 lb (54.9 kg) (02/28 1700)  UOP: 3900/8hr  Gen: A&Ox3, NAD CV: RRR Resp: CTAB Abdomen: soft, NT, ND +BS Uterus: firm, non-tender, below umbilicus Incision: C/D/I  Ext: No edema, no calf tenderness bilaterally, SCDs in place  Labs:  Results for orders placed or performed during the hospital encounter of 09/20/16 (from the past 24 hour(s))  CBC     Status: Abnormal   Collection Time: 09/21/16  5:39 AM  Result Value Ref Range   WBC 13.6 (H) 4.0 - 10.5 K/uL   RBC 3.66 (L) 3.87 - 5.11 MIL/uL   Hemoglobin 11.8 (L) 12.0 - 15.0 g/dL   HCT 32.9 (L) 36.0 - 46.0 %   MCV 89.9 78.0 - 100.0 fL   MCH 32.2 26.0 - 34.0 pg   MCHC 35.9 30.0 - 36.0 g/dL   RDW 12.8 11.5 - 15.5 %   Platelets 274 150 - 400 K/uL  Basic metabolic panel     Status: Abnormal   Collection Time: 09/21/16  5:39 AM  Result Value Ref Range   Sodium 133 (L) 135 - 145 mmol/L   Potassium 3.7 3.5 - 5.1 mmol/L   Chloride 100 (L) 101 - 111 mmol/L   CO2 27 22 - 32 mmol/L   Glucose, Bld 89 65 - 99 mg/dL   BUN 6 6 - 20 mg/dL   Creatinine, Ser 0.66 0.44 - 1.00 mg/dL   Calcium 8.5 (L) 8.9 - 10.3 mg/dL   GFR calc non Af Amer >60 >60 mL/min   GFR calc Af Amer >60 >60 mL/min   Anion gap 6 5 - 15     A/P: Pt is a 51 y.o. s/p LAVH, BS s/p bladder repair, POD#1  - Pain well controlled -GU: UOP is adequate, plan to continue with foley, teaching to be done today.  []  f/u with Dr. Alinda Money in 1 wk.  Continue with Myrbetriq as  needed due to bladder spasms -GI: Tolerating general diet -Activity: encouraged sitting up to chair and ambulation as tolerated -Prophylaxis: early ambulation, SCDs while resting -Labs: stable as above  DISPO: Meeting postoperative milestones appropriately, plan for discharge home today. With follow up as discussed above  Janyth Pupa, DO [phone] (pager) [phone] (office)

## 2016-09-21 NOTE — Discharge Instructions (Signed)
Laparoscopically Assisted Vaginal Hysterectomy, Care After Refer to this sheet in the next few weeks. These instructions provide you with information on caring for yourself after your procedure. Your health care provider may also give you more specific instructions. Your treatment has been planned according to current medical practices, but problems sometimes occur. Call your health care provider if you have any problems or questions after your procedure. What can I expect after the procedure? After your procedure, it is typical to have the following:  Abdominal pain. You will be given pain medicine to control it.  Sore throat from the breathing tube that was inserted during surgery. Follow these instructions at home:  Only take over-the-counter or prescription medicines for pain, discomfort, or fever as directed by your health care provider.  For pain management you may alternate between an NSAID (either Ibuprofen/Aleve) and Tylenol as needed.  For severe pain you may take Percocet- this medication may cause constipation so please be sure to take Colace (stool softener) twice daily as needed.  Additionally this medication has tylenol in it so do not take percocet and tylenol together.  Do not take aspirin. It can cause bleeding.  Do not drive when taking pain medicine.  Follow your health care provider's advice regarding diet, exercise, lifting, driving, and general activities.  Resume your usual diet as directed and allowed.  Get plenty of rest and sleep.  Do not douche, use tampons, or have sexual intercourse for at least 6 weeks, or until your health care provider gives you permission.  Change your bandages (dressings) as directed by your health care provider.  Monitor your temperature and notify your health care provider of a fever.  Take showers instead of baths for 2-3 weeks.  Do not drink alcohol until your health care provider gives you permission.  If you develop  constipation, you may take a mild laxative with your health care provider's permission. Bran foods may help with constipation problems. Drinking enough fluids to keep your urine clear or pale yellow may help as well.  Try to have someone home with you for 1-2 weeks to help around the house.  Keep all of your follow-up appointments as directed by your health care provider. Contact a health care provider if:  You have swelling, redness, or increasing pain around your incision sites.  You have pus coming from your incision.  You notice a bad smell coming from your incision.  Your incision breaks open.  You feel dizzy or lightheaded.  You have pain or bleeding when you urinate.  You have persistent diarrhea.  You have persistent nausea and vomiting.  You have abnormal vaginal discharge.  You have a rash.  You have any type of abnormal reaction or develop an allergy to your medicine.  You have poor pain control with your prescribed medicine. Get help right away if:  You have a fever.  You have severe abdominal pain.  You have chest pain.  You have shortness of breath.  You faint.  You have pain, swelling, or redness in your leg.  You have heavy vaginal bleeding with blood clots. This information is not intended to replace advice given to you by your health care provider. Make sure you discuss any questions you have with your health care provider. Document Released: 06/29/2011 Document Revised: 12/16/2015 Document Reviewed: 01/23/2013 Elsevier Interactive Patient Education  2017 [employer].

## 2016-09-22 ENCOUNTER — Encounter (HOSPITAL_COMMUNITY): Payer: Self-pay | Admitting: Obstetrics & Gynecology

## 2016-09-25 NOTE — Discharge Summary (Signed)
Physician Discharge Summary  Patient ID: Suzanne Marks MRN: [id number] DOB/AGE: 01-16-1966 50 y.o.  Admit date: 09/20/2016 Discharge date: 09/21/2016  Admission Diagnoses: Abnormal uterine bleeding, uterine fibroids  Discharge Diagnoses:  Active Problems:   Abnormal uterine bleeding   Discharged Condition: stable  Hospital Course: 51yo G2P2 who presented for scheduled LAVH, BS due to continued AUB.  Surgical procedure including LAVH, BS and repair of bladder injury.  For information regarding the procedure, please see the op note.  Her postoperative course was uncomplicated.  Pt was discharged home in stable condition on POD#1 with plans for close outpatient follow up regarding foley catheter.  Consults: urology  Significant Diagnostic Studies: labs:  CBC Latest Ref Rng & Units 09/21/2016 09/07/2016 02/27/2013  WBC 4.0 - 10.5 K/uL 13.6(H) 6.9 8.8  Hemoglobin 12.0 - 15.0 g/dL 11.8(L) 12.7 13.2  Hematocrit 36.0 - 46.0 % 32.9(L) 36.8 40.6  Platelets 150 - 400 K/uL 274 304 346     Treatments: IV hydration, antibiotics: Ancef, analgesia: acetaminophen and ibuprofen and surgery: LAVH, BS, repair of bladder injury  Discharge Exam: Blood pressure (!) 107/53, pulse 76, temperature 98.6 F (37 C), temperature source Oral, resp. rate 18, height 5\' 5"  (1.651 m), weight 121 lb (54.9 kg), last menstrual period 07/01/2016, SpO2 99 %. Gen: A&Ox3, NAD CV: RRR Resp: CTAB Abdomen: soft, NT, ND +BS Uterus: firm, non-tender, below umbilicus Incision: C/D/I  Foley in place Ext: No edema, no calf tenderness bilaterally, SCDs in place  Disposition: 01-Home or Self Care  Discharge Instructions    Discharge patient    Complete by:  As directed    Discharge disposition:  01-Home or Self Care   Discharge patient date:  09/21/2016     Allergies as of 09/21/2016      Reactions   Lanolin    Topical causes rash      Medication List    TAKE these medications   ALOE PO Take 1 capsule by mouth  daily.   Digestive Enzyme Caps Take 1 capsule by mouth daily.   docusate sodium 100 MG capsule Commonly known as:  COLACE Take 1 capsule (100 mg total) by mouth 2 (two) times daily.   FUSION PLUS PO Take 1 capsule by mouth daily.   GRAPE SEED EXTRACT PO Take 1 capsule by mouth daily.   ibuprofen 600 MG tablet Commonly known as:  ADVIL,MOTRIN Take 1 tablet (600 mg total) by mouth every 6 (six) hours as needed (mild pain).   MAGNESIUM PO Take 300 mg by mouth daily.   mirabegron ER 50 MG Tb24 tablet Commonly known as:  MYRBETRIQ Take 1 tablet (50 mg total) by mouth daily.   oxybutynin 5 MG tablet Commonly known as:  DITROPAN Take 1 tablet (5 mg total) by mouth every 8 (eight) hours as needed for bladder spasms.   oxyCODONE-acetaminophen 5-325 MG tablet Commonly known as:  PERCOCET/ROXICET Take 1 tablet by mouth every 6 (six) hours as needed for moderate pain (moderate to severe pain (when tolerating fluids)).   Vitamin D 2000 units tablet Take 2,000 Units by mouth daily.      Follow-up Information    Janyth Pupa, M, DO Follow up in 2 week(s).   Specialty:  Obstetrics and Gynecology Contact information: [id number] [place] [address] [phone]           Signed: Annalee Genta 09/25/2016, 7:53 AM

## 2016-09-28 ENCOUNTER — Ambulatory Visit (HOSPITAL_COMMUNITY)
Admission: RE | Admit: 2016-09-28 | Discharge: 2016-09-28 | Disposition: A | Discharge status: Home / Self Care | Payer: No Typology Code available for payment source | Source: Ambulatory Visit | Attending: Urology | Admitting: Urology

## 2016-09-28 ENCOUNTER — Encounter (HOSPITAL_COMMUNITY): Payer: Self-pay | Admitting: Radiology

## 2016-09-28 DIAGNOSIS — S3729XD Other injury of bladder, subsequent encounter: Secondary | ICD-10-CM | POA: Diagnosis not present

## 2016-09-28 DIAGNOSIS — S3729XS Other injury of bladder, sequela: Secondary | ICD-10-CM | POA: Diagnosis present

## 2016-09-28 DIAGNOSIS — X58XXXS Exposure to other specified factors, sequela: Secondary | ICD-10-CM | POA: Insufficient documentation

## 2016-09-28 MED ORDER — IOTHALAMATE MEGLUMINE 17.2 % UR SOLN
250.0000 mL | Freq: Once | URETHRAL | Status: AC | PRN
  Administered 2016-09-28: 250 mL via INTRAVESICAL

## 2017-04-13 ENCOUNTER — Encounter: Payer: Self-pay | Admitting: *Deleted

## 2017-04-13 ENCOUNTER — Ambulatory Visit (INDEPENDENT_AMBULATORY_CARE_PROVIDER_SITE_OTHER): Payer: No Typology Code available for payment source | Admitting: Diagnostic Neuroimaging

## 2017-04-13 VITALS — BP 151/86 | HR 62 | Ht 65.5 in | Wt 115.4 lb

## 2017-04-13 DIAGNOSIS — R253 Fasciculation: Secondary | ICD-10-CM

## 2017-04-13 DIAGNOSIS — R200 Anesthesia of skin: Secondary | ICD-10-CM

## 2017-04-13 NOTE — Progress Notes (Signed)
[facility]  PATIENT: Suzanne Marks DOB: 1965/10/23  REFERRING CLINICIAN: Stephanie Acre, S HISTORY FROM: patient and chart review REASON FOR VISIT: new consult    HISTORICAL  CHIEF COMPLAINT:  Chief Complaint  Patient presents with  . NP  Stephanie Acre  . Numbness    bilateral numbness/ tingling legs, and feet.  Was seen 2014 with Dr. Leta Baptist.  Muscle twitching in bil calves.  Pain in glutes, had massage then pain.    HISTORY OF PRESENT ILLNESS:   NEW HPI (04/13/17, VRP): 51 year old female here for evaluation of numbness, tingling, pain. Patient was previously evaluated in 2014 for similar issue. Since last visit, was doing well until early summer, with increased muscle twitching. In August 2018, she increased her usual 7 mile walk to include some running sessions (because she was running late in the morning and wanted to finish more quickly) 3 days out of 1 week. Then she felt more pain in the buttock region bilaterally. She treated this with a massage treatment, but then felt intermittent numbness in legs. She discussed this with her family, and was concerned about this progression of new symptoms. Therefore she requested evaluation in our neurology clinic. Since that time her symptoms have slightly improved, mainly since yesterday.    UPDATE 05/28/13 (LL): Suzanne Marks returns for follow up because her symptoms have not improved.  She is still having  muscle twitching in different areas of her body (calf, face, abdomen, gluteus) and paresthesias in her hands and feet.  She recently had anemia panel done which was normal.  Her HAM-A score is 20 (moderate anxiety).  She admits to being a worrier, but would rather exercise and relax natural ways than take medicines.  She has an Rx for Xanax but has not taken many.  PRIOR HPI (02/05/13): Suzanne Marks is a 51 year old right-handed Caucasian female who comes in for consultation for bilateral arm paresthesias left greater than right  and muscle twitching. Patient reports that earlier in the year she was driving home from [facility] and noticed numbness and tingling in the anterior aspects of her bilateral forearms and some numbness in the left hand while gripping the steering wheel. Symptoms were intermittent and occurred during brief intervals over the next few weeks.  She was referred for nerve conduction study and EMG which was completed on 09/24/2012 and showed bilateral ulnar sensory and motor responses were normal. Bilateral median sensory response showed mildly prolonged peak latency. Bilateral median motor responses showed mildly prolonged distal latency consistent with mild carpal tunnel syndrome.  Afterwards patient tried wearing wrist splints during the night which were uncomfortable and she started using a keyboard pad and mouse pad. Symptoms disappeared within one week after adding these items and have not recurred. Patient has noticed that she has increased muscle twitching occurring in bilateral legs and around right eye which started occurring after she found out her mother's brain tumor had returned and she had less than 2 months to live. Patient realizes that she has greatly increased anxiety since finding out about her mother's condition.  Twitching is intermittent and last only a few seconds mostly in her legs.  She knows of nothing that triggers the symptoms or necessarily relieves his symptoms.  Her mother has passed about 2 months ago.  She has also been experiencing palpitations which she has seen her primary care provider for and thought to be having panic attacks and was prescribed prn Xanax.  She doesn't like to take this  medication and less absolutely needed. She states she went for massage and was told that her muscles were extremely tight all over.  She does exercise 1-1-1/2 hours per day to try to help relieve stress and does not consume caffeine on a daily basis.  Of note when she went on a week's vacation to the  beach, she did not have any of the mentioned symptoms.     REVIEW OF SYSTEMS: Full 14 system review of systems performed and negative with exception of: Blurred vision aching muscles anxiety.  ALLERGIES: Allergies  Allergen Reactions  . Lanolin     Topical causes rash    HOME MEDICATIONS: Outpatient Medications Prior to Visit  Medication Sig Dispense Refill  . ALOE PO Take 1 capsule by mouth 2 (two) times daily.     . Cholecalciferol (VITAMIN D) 2000 units tablet Take 2,000 Units by mouth daily.    . Digestive Enzyme CAPS Take 1 capsule by mouth 3 (three) times daily with meals as needed.     . Iron-FA-B Cmp-C-Biot-Probiotic (FUSION PLUS PO) Take 1 capsule by mouth daily.    Marland Kitchen MAGNESIUM PO Take 300 mg by mouth daily.    Marland Kitchen docusate sodium (COLACE) 100 MG capsule Take 1 capsule (100 mg total) by mouth 2 (two) times daily. (Patient not taking: Reported on 04/13/2017) 30 capsule 0  . GRAPE SEED EXTRACT PO Take 1 capsule by mouth daily.    Marland Kitchen ibuprofen (ADVIL,MOTRIN) 600 MG tablet Take 1 tablet (600 mg total) by mouth every 6 (six) hours as needed (mild pain). (Patient not taking: Reported on 04/13/2017) 30 tablet 0  . mirabegron ER (MYRBETRIQ) 50 MG TB24 tablet Take 1 tablet (50 mg total) by mouth daily. (Patient not taking: Reported on 04/13/2017) 30 tablet 0  . oxybutynin (DITROPAN) 5 MG tablet Take 1 tablet (5 mg total) by mouth every 8 (eight) hours as needed for bladder spasms. (Patient not taking: Reported on 04/13/2017) 30 tablet 0  . oxyCODONE-acetaminophen (PERCOCET/ROXICET) 5-325 MG tablet Take 1 tablet by mouth every 6 (six) hours as needed for moderate pain (moderate to severe pain (when tolerating fluids)). (Patient not taking: Reported on 04/13/2017) 10 tablet 0   No facility-administered medications prior to visit.     PAST MEDICAL HISTORY: Past Medical History:  Diagnosis Date  . Anemia   . Anxiety   . Bell's palsy 2000  . Carpal tunnel syndrome    both wrists  .  Depression   . GERD (gastroesophageal reflux disease)   . Low ferritin level    no iron taken    PAST SURGICAL HISTORY: Past Surgical History:  Procedure Laterality Date  . BLADDER REPAIR  09/20/2016   Procedure: BLADDER REPAIR;  Surgeon: Janyth Pupa, DO;  Location: [facility];  Service: Gynecology;;  . Franki [place] STUDY N/A 01/21/2014   Procedure: BRAVO [place];  Surgeon: Arta Silence, MD;  Location: [facility];  Service: Endoscopy;  Laterality: N/A;  . ESOPHAGOGASTRODUODENOSCOPY (EGD) WITH PROPOFOL N/A 01/21/2014   Procedure: ESOPHAGOGASTRODUODENOSCOPY (EGD) WITH PROPOFOL;  Surgeon: Arta Silence, MD;  Location: [facility];  Service: Endoscopy;  Laterality: N/A;  . EXTERNAL EAR SURGERY     age 4  . FOOT SURGERY  2012   left  . GUM SURGERY  2011  . LAPAROSCOPIC VAGINAL HYSTERECTOMY WITH SALPINGECTOMY Bilateral 09/20/2016   Procedure: LAPAROSCOPIC ASSISTED VAGINAL HYSTERECTOMY WITH SALPINGECTOMY;  Surgeon: Janyth Pupa, DO;  Location: [facility];  Service: Gynecology;  Laterality: Bilateral;  . NOVASURE ABLATION  03/2006  . REFRACTIVE SURGERY Bilateral    lasik  . WISDOM TOOTH EXTRACTION  1990    FAMILY HISTORY: Family History  Problem Relation Age of Onset  . Hypertension Father   . Cancer Mother        Glioblastoma  . Seizures Mother   . Heart Problems Maternal Grandmother   . Parkinsonism Maternal Grandmother   . Depression Maternal Grandmother   . Heart Problems Maternal Grandfather   . Heart Problems Paternal Grandmother   . Heart Problems Paternal Grandfather     SOCIAL HISTORY:  Social History   Social History  . Marital status: Married    Spouse name: Carloyn Manner  . Number of children: 2  . Years of education: College   Occupational History  .  [employer]   Social History Main Topics  . Smoking status: Never Smoker  . Smokeless tobacco: Never Used  . Alcohol use Yes     Comment: Rare  . Drug use: No  . Sexual activity: Yes    Birth control/  protection: Other-see comments, Surgical     Comment: vasectomy   Other Topics Concern  . Not on file   Social History Narrative   Patient lives at home with her family.  Works at [employer].  2 Children.     Caffeine Use: occasionally (iced tea)     PHYSICAL EXAM  GENERAL EXAM/CONSTITUTIONAL: Vitals:  Vitals:   04/13/17 0804  BP: (!) 151/86  Pulse: 62  Weight: 115 lb 6.4 oz (52.3 kg)  Height: 5' 5.5" (1.664 m)     Body mass index is 18.91 kg/m.  Visual Acuity Screening   Right eye Left eye Both eyes  Without correction: 20/40 20/50   With correction:        Patient is in no distress; well developed, nourished and groomed; neck is supple  CARDIOVASCULAR:  Examination of carotid arteries is normal; no carotid bruits  Regular rate and rhythm, no murmurs  Examination of peripheral vascular system by observation and palpation is normal  EYES:  Ophthalmoscopic exam of optic discs and posterior segments is normal; no papilledema or hemorrhages  MUSCULOSKELETAL:  Gait, strength, tone, movements noted in Neurologic exam below  NEUROLOGIC: MENTAL STATUS:  No flowsheet data found.  awake, alert, oriented to person, place and time  recent and remote memory intact  normal attention and concentration  language fluent, comprehension intact, naming intact,   fund of knowledge appropriate  CRANIAL NERVE:   2nd - no papilledema on fundoscopic exam  2nd, 3rd, 4th, 6th - pupils equal and reactive to light, visual fields full to confrontation, extraocular muscles intact, no nystagmus  5th - facial sensation symmetric  7th - facial strength symmetric  8th - hearing intact  9th - palate elevates symmetrically, uvula midline  11th - shoulder shrug symmetric  12th - tongue protrusion midline  MOTOR:   normal bulk and tone, full strength in the BUE, BLE  NO FASCICULATIONS  SENSORY:   normal and symmetric to light touch, pinprick, temperature,  vibration  COORDINATION:   finger-nose-finger, fine finger movements normal  REFLEXES:   deep tendon reflexes present and symmetric  GAIT/STATION:   narrow based gait; able to walk tandem; romberg is negative    DIAGNOSTIC DATA (LABS, IMAGING, TESTING) - I reviewed patient records, labs, notes, testing and imaging myself where available.  Lab Results  Component Value Date   WBC 13.6 (H) 09/21/2016   HGB 11.8 (L) 09/21/2016  HCT 32.9 (L) 09/21/2016   MCV 89.9 09/21/2016   PLT 274 09/21/2016      Component Value Date/Time   NA 133 (L) 09/21/2016 0539   K 3.7 09/21/2016 0539   CL 100 (L) 09/21/2016 0539   CO2 27 09/21/2016 0539   GLUCOSE 89 09/21/2016 0539   BUN 6 09/21/2016 0539   CREATININE 0.66 09/21/2016 0539   CREATININE 0.66 02/27/2013 1501   CALCIUM 8.5 (L) 09/21/2016 0539   PROT 6.5 09/07/2016 1510   ALBUMIN 4.1 09/07/2016 1510   AST 38 09/07/2016 1510   ALT 34 09/07/2016 1510   ALKPHOS 58 09/07/2016 1510   BILITOT 0.5 09/07/2016 1510   GFRNONAA >60 09/21/2016 0539   GFRAA >60 09/21/2016 0539   Lab Results  Component Value Date   CHOL 188 02/27/2013   HDL 75 02/27/2013   LDLCALC 100 (H) 02/27/2013   TRIG 66 02/27/2013   CHOLHDL 2.5 02/27/2013   Lab Results  Component Value Date   HGBA1C 5.1 07/17/2012   No results found for: VITAMINB12 Lab Results  Component Value Date   TSH 1.897 02/27/2013    06/13/13 MRI brain  - normal  06/04/13 EMG/NCS Mild abnormal study demonstrating: 1. 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 2. No evidence of diffuse large fiber neuropathy or myopathy at this time 3. Compared to prior study from 09/24/12, right median motor response distal latency has improved otherwise no significant change.     ASSESSMENT AND PLAN  51 y.o. year old female here with intermittent numbness and twitching since 2014, with change in her symptoms over last  few months, in the setting of increased stress. Also with new onset of bilateral gluteal pain, lower extremity numbness, following increased physical activity regimen. Neurologic examination is unremarkable. Most likely represents benign fasciculations and benign paresthesias. Previous workup in 2014 was unremarkable.  I reassured patient, advised her to focus on appropriate rest and recovery in addition to her excellent nutrition and fitness regimen.  We will monitor symptoms for now. If symptoms significantly increase or fail to improve we may consider further testing.   Dx:  1. Muscle twitch   2. Bilateral leg numbness      PLAN: - monitor symptoms; focus on rest and recovery  Return if symptoms worsen or fail to improve, for return to PCP.    Penni Bombard, MD 1/93/7902, 4:09 AM Certified in Neurology, Neurophysiology and Neuroimaging  [facility] [address] [place], [place] [address] [phone]

## 2017-06-06 ENCOUNTER — Other Ambulatory Visit: Payer: Self-pay | Admitting: Family Medicine

## 2017-06-06 DIAGNOSIS — Z139 Encounter for screening, unspecified: Secondary | ICD-10-CM

## 2017-08-22 ENCOUNTER — Ambulatory Visit
Admission: RE | Admit: 2017-08-22 | Discharge: 2017-08-22 | Disposition: A | Discharge status: Home / Self Care | Payer: No Typology Code available for payment source | Source: Ambulatory Visit | Attending: Family Medicine | Admitting: Family Medicine

## 2017-08-22 DIAGNOSIS — Z139 Encounter for screening, unspecified: Secondary | ICD-10-CM

## 2018-06-07 ENCOUNTER — Other Ambulatory Visit: Payer: Self-pay | Admitting: Family Medicine

## 2018-06-07 DIAGNOSIS — Z1231 Encounter for screening mammogram for malignant neoplasm of breast: Secondary | ICD-10-CM

## 2018-08-30 ENCOUNTER — Ambulatory Visit
Admission: RE | Admit: 2018-08-30 | Discharge: 2018-08-30 | Disposition: A | Discharge status: Home / Self Care | Payer: PRIVATE HEALTH INSURANCE | Source: Ambulatory Visit | Attending: Family Medicine | Admitting: Family Medicine

## 2018-08-30 DIAGNOSIS — Z1231 Encounter for screening mammogram for malignant neoplasm of breast: Secondary | ICD-10-CM

## 2019-07-07 ENCOUNTER — Other Ambulatory Visit: Payer: Self-pay | Admitting: Family Medicine

## 2019-07-07 DIAGNOSIS — Z1231 Encounter for screening mammogram for malignant neoplasm of breast: Secondary | ICD-10-CM

## 2019-09-01 ENCOUNTER — Ambulatory Visit
Admission: RE | Admit: 2019-09-01 | Discharge: 2019-09-01 | Disposition: A | Discharge status: Home / Self Care | Payer: PRIVATE HEALTH INSURANCE | Source: Ambulatory Visit | Attending: Family Medicine | Admitting: Family Medicine

## 2019-09-01 ENCOUNTER — Other Ambulatory Visit: Payer: Self-pay

## 2019-09-01 DIAGNOSIS — Z1231 Encounter for screening mammogram for malignant neoplasm of breast: Secondary | ICD-10-CM

## 2020-01-15 ENCOUNTER — Encounter: Payer: Self-pay | Admitting: *Deleted

## 2020-01-20 ENCOUNTER — Ambulatory Visit (INDEPENDENT_AMBULATORY_CARE_PROVIDER_SITE_OTHER): Payer: No Typology Code available for payment source | Admitting: Diagnostic Neuroimaging

## 2020-01-20 ENCOUNTER — Telehealth: Payer: Self-pay | Admitting: Diagnostic Neuroimaging

## 2020-01-20 ENCOUNTER — Other Ambulatory Visit: Payer: Self-pay

## 2020-01-20 ENCOUNTER — Encounter: Payer: Self-pay | Admitting: Diagnostic Neuroimaging

## 2020-01-20 VITALS — BP 142/80 | HR 60 | Ht 65.0 in | Wt 117.8 lb

## 2020-01-20 DIAGNOSIS — R253 Fasciculation: Secondary | ICD-10-CM | POA: Diagnosis not present

## 2020-01-20 DIAGNOSIS — R202 Paresthesia of skin: Secondary | ICD-10-CM | POA: Diagnosis not present

## 2020-01-20 DIAGNOSIS — R200 Anesthesia of skin: Secondary | ICD-10-CM

## 2020-01-20 NOTE — Telephone Encounter (Signed)
[employer] order sent to GI. They will obtain the auth and reach out to the patient to schedule.

## 2020-01-20 NOTE — Progress Notes (Signed)
[facility]  PATIENT: Suzanne Marks DOB: 11-May-1966  REFERRING CLINICIAN: Jonathon Jordan, MD HISTORY FROM: patient and chart review REASON FOR VISIT: new consult    HISTORICAL  CHIEF COMPLAINT:  Chief Complaint  Patient presents with  . Numbness/burning in Jaw    rm 7 "April 15th mouth twitching x 4 days, 10 days later numbness in jaw, stopped then started back; heels /palms burning intermittently"     HISTORY OF PRESENT ILLNESS:   UPDATE (01/20/20, VRP): Since last visit, new sxs of numbness, pain, twitching, burning sensations since April 2021. Lower jaw, lips, hands, heels, eyes. Symptoms are intermittent. Severity is mild-moderate. No alleviating or aggravating factors.  Recent labs with PCP in Feb 2021 were normal including B12. Patient is active with walking and exercise. Mainly plant based diet with some supplements. No recent changes in stress, sleep or activity. Went to dentist and rule out dental / jaw issues.   NEW HPI (04/13/17, VRP): 54 year old female here for evaluation of numbness, tingling, pain. Patient was previously evaluated in 2014 for similar issue. Since last visit, was doing well until early summer, with increased muscle twitching. In August 2018, she increased her usual 7 mile walk to include some running sessions (because she was running late in the morning and wanted to finish more quickly) 3 days out of 1 week. Then she felt more pain in the buttock region bilaterally. She treated this with a massage treatment, but then felt intermittent numbness in legs. She discussed this with her family, and was concerned about this progression of new symptoms. Therefore she requested evaluation in our neurology clinic. Since that time her symptoms have slightly improved, mainly since yesterday.    UPDATE 05/28/13 (LL): [name] returns for follow up because her symptoms have not improved.  She is still having muscle twitching in different areas of her  body (calf, face, abdomen, gluteus) and paresthesias in her hands and feet.  She recently had anemia panel done which was normal.  Her HAM-A score is 20 (moderate anxiety).  She admits to being a worrier, but would rather exercise and relax natural ways than take medicines.  She has an Rx for Xanax but has not taken many.  PRIOR HPI (02/05/13): Suzanne Marks is a 54 year old right-handed Caucasian female who comes in for consultation for bilateral arm paresthesias left greater than right and muscle twitching. Patient reports that earlier in the year she was driving home from [facility] and noticed numbness and tingling in the anterior aspects of her bilateral forearms and some numbness in the left hand while gripping the steering wheel. Symptoms were intermittent and occurred during brief intervals over the next few weeks.  She was referred for nerve conduction study and EMG which was completed on 09/24/2012 and showed bilateral ulnar sensory and motor responses were normal. Bilateral median sensory response showed mildly prolonged peak latency. Bilateral median motor responses showed mildly prolonged distal latency consistent with mild carpal tunnel syndrome.  Afterwards patient tried wearing wrist splints during the night which were uncomfortable and she started using a keyboard pad and mouse pad. Symptoms disappeared within one week after adding these items and have not recurred. Patient has noticed that she has increased muscle twitching occurring in bilateral legs and around right eye which started occurring after she found out her mother's brain tumor had returned and she had less than 2 months to live. Patient realizes that she has greatly increased anxiety since finding out about her  mother's condition.  Twitching is intermittent and last only a few seconds mostly in her legs.  She knows of nothing that triggers the symptoms or necessarily relieves his symptoms.  Her mother has passed about 2 months ago.   She has also been experiencing palpitations which she has seen her primary care provider for and thought to be having panic attacks and was prescribed prn Xanax.  She doesn't like to take this medication and less absolutely needed. She states she went for massage and was told that her muscles were extremely tight all over.  She does exercise 1-1-1/2 hours per day to try to help relieve stress and does not consume caffeine on a daily basis.  Of note when she went on a week's vacation to the beach, she did not have any of the mentioned symptoms.     REVIEW OF SYSTEMS: Full 14 system review of systems performed and negative with exception of: as per HPI.  ALLERGIES: Allergies  Allergen Reactions  . Lanolin     Topical causes rash    HOME MEDICATIONS: Outpatient Medications Prior to Visit  Medication Sig Dispense Refill  . b complex vitamins tablet Take 1 tablet by mouth. Takes 1 MWF    . Cholecalciferol (VITAMIN D) 2000 units tablet Take 2,000 Units by mouth daily.    Marland Kitchen co-enzyme Q-10 30 MG capsule Take 30 mg by mouth daily.    . Digestive Enzyme CAPS Take 1 capsule by mouth 3 (three) times daily with meals as needed.     . Iron-FA-B Cmp-C-Biot-Probiotic (FUSION PLUS PO) Take 1 capsule by mouth daily.    Marland Kitchen MAGNESIUM PO Take 300 mg by mouth daily.    Marland Kitchen OVER THE COUNTER MEDICATION Turmeric Curcumin w/ blk pepper with breakfast.    . OVER THE COUNTER MEDICATION One Metagenics Ultra flora Blance Probiotic when wakes up    . UNABLE TO FIND Med Name: algae based omega 3, 2 pills daily    . UNABLE TO FIND Med Name: black kohosh 40 mg 2 x daily    . ALOE PO Take 1 capsule by mouth 2 (two) times daily.      No facility-administered medications prior to visit.    PAST MEDICAL HISTORY: Past Medical History:  Diagnosis Date  . Anemia   . Anxiety   . Bell's palsy 2000  . Carpal tunnel syndrome    both wrists  . Depression   . GERD (gastroesophageal reflux disease)   . Low ferritin level     no iron taken    PAST SURGICAL HISTORY: Past Surgical History:  Procedure Laterality Date  . BLADDER REPAIR  09/20/2016   Procedure: BLADDER REPAIR;  Surgeon: Janyth Pupa, DO;  Location: [facility];  Service: Gynecology;;  . Franki [place] STUDY N/A 01/21/2014   Procedure: BRAVO [place];  Surgeon: Arta Silence, MD;  Location: [facility];  Service: Endoscopy;  Laterality: N/A;  . ESOPHAGOGASTRODUODENOSCOPY (EGD) WITH PROPOFOL N/A 01/21/2014   Procedure: ESOPHAGOGASTRODUODENOSCOPY (EGD) WITH PROPOFOL;  Surgeon: Arta Silence, MD;  Location: [facility];  Service: Endoscopy;  Laterality: N/A;  . EXTERNAL EAR SURGERY     age 72  . FOOT SURGERY  2012   left  . GUM SURGERY  2011  . LAPAROSCOPIC VAGINAL HYSTERECTOMY WITH SALPINGECTOMY Bilateral 09/20/2016   Procedure: LAPAROSCOPIC ASSISTED VAGINAL HYSTERECTOMY WITH SALPINGECTOMY;  Surgeon: Janyth Pupa, DO;  Location: [facility];  Service: Gynecology;  Laterality: Bilateral;  . NOVASURE ABLATION  03/2006  . REFRACTIVE SURGERY Bilateral  lasik  . WISDOM TOOTH EXTRACTION  1990    FAMILY HISTORY: Family History  Problem Relation Age of Onset  . Hypertension Father   . Cancer Mother        Glioblastoma  . Seizures Mother   . Heart Problems Maternal Grandmother   . Parkinsonism Maternal Grandmother   . Depression Maternal Grandmother   . Heart Problems Maternal Grandfather   . Heart Problems Paternal Grandmother   . Heart Problems Paternal Grandfather     SOCIAL HISTORY:  Social History   Socioeconomic History  . Marital status: Married    Spouse name: Carloyn Manner  . Number of children: 2  . Years of education: College  . Highest education level: Not on file  Occupational History    Employer: [employer]  Tobacco Use  . Smoking status: Never Smoker  . Smokeless tobacco: Never Used  Substance and Sexual Activity  . Alcohol use: Yes    Comment: Rare  . Drug use: No  . Sexual activity: Yes    Birth control/protection:  Other-see comments, Surgical    Comment: vasectomy  Other Topics Concern  . Not on file  Social History Narrative   Patient lives at home with her family.  Works at [employer].  2 Children.     Caffeine Use: occasionally (iced tea)   Social Determinants of Health   Financial Resource Strain:   . Difficulty of Paying Living Expenses:   Food Insecurity:   . Worried About Charity fundraiser in the Last Year:   . Arboriculturist in the Last Year:   Transportation Needs:   . Film/video editor (Medical):   Marland Kitchen Lack of Transportation (Non-Medical):   Physical Activity:   . Days of Exercise per Week:   . Minutes of Exercise per Session:   Stress:   . Feeling of Stress :   Social Connections:   . Frequency of Communication with Friends and Family:   . Frequency of Social Gatherings with Friends and Family:   . Attends Religious Services:   . Active Member of Clubs or Organizations:   . Attends Archivist Meetings:   Marland Kitchen Marital Status:   Intimate Partner Violence:   . Fear of Current or Ex-Partner:   . Emotionally Abused:   Marland Kitchen Physically Abused:   . Sexually Abused:      PHYSICAL EXAM  GENERAL EXAM/CONSTITUTIONAL: Vitals:  Vitals:   01/20/20 1116  BP: (!) 142/80  Pulse: 60  Weight: 117 lb 12.8 oz (53.4 kg)  Height: '5\' 5"'  (1.651 m)   Body mass index is 19.6 kg/m. No exam data present  Patient is in no distress; well developed, nourished and groomed; neck is supple  CARDIOVASCULAR:  Examination of carotid arteries is normal; no carotid bruits  Regular rate and rhythm, no murmurs  Examination of peripheral vascular system by observation and palpation is normal  EYES:  Ophthalmoscopic exam of optic discs and posterior segments is normal; no papilledema or hemorrhages  MUSCULOSKELETAL:  Gait, strength, tone, movements noted in Neurologic exam below  NEUROLOGIC: MENTAL STATUS:  No flowsheet data found.  awake, alert, oriented to person,  place and time  recent and remote memory intact  normal attention and concentration  language fluent, comprehension intact, naming intact,   fund of knowledge appropriate  CRANIAL NERVE:   2nd - no papilledema on fundoscopic exam  2nd, 3rd, 4th, 6th - pupils equal and reactive to light, visual fields full  to confrontation, extraocular muscles intact, no nystagmus  5th - facial sensation symmetric  7th - facial strength symmetric  8th - hearing intact  9th - palate elevates symmetrically, uvula midline  11th - shoulder shrug symmetric  12th - tongue protrusion midline  MOTOR:   normal bulk and tone, full strength in the BUE, BLE  NO FASCICULATIONS  SENSORY:   normal and symmetric to light touch, temperature, vibration  COORDINATION:   finger-nose-finger, fine finger movements normal  REFLEXES:   deep tendon reflexes present and symmetric  GAIT/STATION:   narrow based gait    DIAGNOSTIC DATA (LABS, IMAGING, TESTING) - I reviewed patient records, labs, notes, testing and imaging myself where available.  Lab Results  Component Value Date   WBC 13.6 (H) 09/21/2016   HGB 11.8 (L) 09/21/2016   HCT 32.9 (L) 09/21/2016   MCV 89.9 09/21/2016   PLT 274 09/21/2016      Component Value Date/Time   NA 133 (L) 09/21/2016 0539   K 3.7 09/21/2016 0539   CL 100 (L) 09/21/2016 0539   CO2 27 09/21/2016 0539   GLUCOSE 89 09/21/2016 0539   BUN 6 09/21/2016 0539   CREATININE 0.66 09/21/2016 0539   CREATININE 0.66 02/27/2013 1501   CALCIUM 8.5 (L) 09/21/2016 0539   PROT 6.5 09/07/2016 1510   ALBUMIN 4.1 09/07/2016 1510   AST 38 09/07/2016 1510   ALT 34 09/07/2016 1510   ALKPHOS 58 09/07/2016 1510   BILITOT 0.5 09/07/2016 1510   GFRNONAA >60 09/21/2016 0539   GFRAA >60 09/21/2016 0539   Lab Results  Component Value Date   CHOL 188 02/27/2013   HDL 75 02/27/2013   LDLCALC 100 (H) 02/27/2013   TRIG 66 02/27/2013   CHOLHDL 2.5 02/27/2013   Lab Results    Component Value Date   HGBA1C 5.1 07/17/2012   No results found for: VITAMINB12 Lab Results  Component Value Date   TSH 1.897 02/27/2013    06/13/13 MRI brain  - normal  06/04/13 EMG/NCS Mild abnormal study demonstrating: 1. 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 2. No evidence of diffuse large fiber neuropathy or myopathy at this time 3. Compared to prior study from 09/24/12, right median motor response distal latency has improved otherwise no significant change.     ASSESSMENT AND PLAN  54 y.o. year old female here with intermittent numbness and twitching since 2014. May represent benign fasciculations and benign paresthesias. Previous workup in 2014 was unremarkable.    Dx:  1. Numbness and tingling   2. Muscle twitching      PLAN:  - check MRI brain - check labs  Orders Placed This Encounter  Procedures  . MR BRAIN W WO CONTRAST  . SPEP with IFE  . ANA w/Reflex  . SSA, SSB  . ESR  . CRP  . Vitamin B6  . Vitamin B1  . ANCA   Return for pending if symptoms worsen or fail to improve.    Penni Bombard, MD 01/19/6380, 77:11 AM Certified in Neurology, Neurophysiology and Neuroimaging  [facility] [address] [place], [place] [address] [phone]

## 2020-01-21 ENCOUNTER — Other Ambulatory Visit: Payer: Self-pay | Admitting: Diagnostic Neuroimaging

## 2020-01-22 ENCOUNTER — Telehealth: Payer: Self-pay

## 2020-01-22 NOTE — Telephone Encounter (Signed)
Spoke to pt, results given per MD  Pt voiced understanding and happy about results

## 2020-01-22 NOTE — Telephone Encounter (Signed)
Borderline ANCA lab is non-specific (not enough to make a diagnosis). Nothing to be concerned at this time. Will follow up other labs. -VRP

## 2020-01-22 NOTE — Telephone Encounter (Signed)
Pt calling asking about her lab results Pt viewed results on mychart

## 2020-01-23 ENCOUNTER — Ambulatory Visit (INDEPENDENT_AMBULATORY_CARE_PROVIDER_SITE_OTHER): Payer: No Typology Code available for payment source

## 2020-01-23 ENCOUNTER — Other Ambulatory Visit: Payer: Self-pay

## 2020-01-23 ENCOUNTER — Encounter: Payer: Self-pay | Admitting: Podiatry

## 2020-01-23 ENCOUNTER — Ambulatory Visit (INDEPENDENT_AMBULATORY_CARE_PROVIDER_SITE_OTHER): Payer: No Typology Code available for payment source | Admitting: Podiatry

## 2020-01-23 DIAGNOSIS — M722 Plantar fascial fibromatosis: Secondary | ICD-10-CM

## 2020-01-23 MED ORDER — MELOXICAM 15 MG PO TABS
15.0000 mg | ORAL_TABLET | Freq: Every day | ORAL | 3 refills | Status: DC
Start: 2020-01-23 — End: 2020-10-27

## 2020-01-23 MED ORDER — METHYLPREDNISOLONE 4 MG PO TBPK
ORAL_TABLET | ORAL | 0 refills | Status: DC
Start: 2020-01-23 — End: 2020-10-27

## 2020-01-23 NOTE — Progress Notes (Signed)
Subjective:  Patient ID: Suzanne Marks, female    DOB: 09/26/1965,  MRN: [id number] HPI Chief Complaint  Patient presents with  . Foot Pain    Plantar heel left - aching x few months, AM pain, tried ice, stretching, beach trip made worse, walks 7 miles daily, tried wearing OTC PF sleeve  . New Patient (Initial Visit)    54 y.o. female presents with the above complaint.   ROS: Denies fever chills nausea vomiting muscle aches pains calf pain back pain chest pain shortness of breath.  Past Medical History:  Diagnosis Date  . Anemia   . Anxiety   . Bell's palsy 2000  . Carpal tunnel syndrome    both wrists  . Depression   . GERD (gastroesophageal reflux disease)   . Low ferritin level    no iron taken   Past Surgical History:  Procedure Laterality Date  . BLADDER REPAIR  09/20/2016   Procedure: BLADDER REPAIR;  Surgeon: Janyth Pupa, DO;  Location: [facility];  Service: Gynecology;;  . Franki [place] STUDY N/A 01/21/2014   Procedure: BRAVO [place];  Surgeon: Arta Silence, MD;  Location: [facility];  Service: Endoscopy;  Laterality: N/A;  . ESOPHAGOGASTRODUODENOSCOPY (EGD) WITH PROPOFOL N/A 01/21/2014   Procedure: ESOPHAGOGASTRODUODENOSCOPY (EGD) WITH PROPOFOL;  Surgeon: Arta Silence, MD;  Location: [facility];  Service: Endoscopy;  Laterality: N/A;  . EXTERNAL EAR SURGERY     age 75  . FOOT SURGERY  2012   left  . GUM SURGERY  2011  . LAPAROSCOPIC VAGINAL HYSTERECTOMY WITH SALPINGECTOMY Bilateral 09/20/2016   Procedure: LAPAROSCOPIC ASSISTED VAGINAL HYSTERECTOMY WITH SALPINGECTOMY;  Surgeon: Janyth Pupa, DO;  Location: [facility];  Service: Gynecology;  Laterality: Bilateral;  . NOVASURE ABLATION  03/2006  . REFRACTIVE SURGERY Bilateral    lasik  . WISDOM TOOTH EXTRACTION  1990    Current Outpatient Medications:  .  b complex vitamins tablet, Take 1 tablet by mouth. Takes 1 MWF, Disp: , Rfl:  .  Cholecalciferol (VITAMIN D) 2000 units tablet, Take 2,000 Units by mouth daily.,  Disp: , Rfl:  .  co-enzyme Q-10 30 MG capsule, Take 30 mg by mouth daily., Disp: , Rfl:  .  Digestive Enzyme CAPS, Take 1 capsule by mouth 3 (three) times daily with meals as needed. , Disp: , Rfl:  .  Iron-FA-B Cmp-C-Biot-Probiotic (FUSION PLUS PO), Take 1 capsule by mouth daily., Disp: , Rfl:  .  MAGNESIUM PO, Take 300 mg by mouth daily., Disp: , Rfl:  .  meloxicam (MOBIC) 15 MG tablet, Take 1 tablet (15 mg total) by mouth daily., Disp: 30 tablet, Rfl: 3 .  methylPREDNISolone (MEDROL DOSEPAK) 4 MG TBPK tablet, 6 day dose pack - take as directed, Disp: 21 tablet, Rfl: 0 .  OVER THE COUNTER MEDICATION, Turmeric Curcumin w/ blk pepper with breakfast., Disp: , Rfl:  .  OVER THE COUNTER MEDICATION, One Metagenics Ultra flora Blance Probiotic when wakes up, Disp: , Rfl:  .  UNABLE TO FIND, Med Name: algae based omega 3, 2 pills daily, Disp: , Rfl:  .  UNABLE TO FIND, Med Name: black kohosh 40 mg 2 x daily, Disp: , Rfl:   Allergies  Allergen Reactions  . Lanolin     Topical causes rash   Review of Systems Objective:  There were no vitals filed for this visit.  General: Well developed, nourished, in no acute distress, alert and oriented x3   Dermatological: Skin is warm, dry and supple bilateral. Nails  x 10 are well maintained; remaining integument appears unremarkable at this time. There are no open sores, no preulcerative lesions, no rash or signs of infection present.  Vascular: Dorsalis Pedis artery and Posterior Tibial artery pedal pulses are 2/4 bilateral with immedate capillary fill time. Pedal hair growth present. No varicosities and no lower extremity edema present bilateral.   Neruologic: Grossly intact via light touch bilateral. Vibratory intact via tuning fork bilateral. Protective threshold with Semmes Wienstein monofilament intact to all pedal sites bilateral. Patellar and Achilles deep tendon reflexes 2+ bilateral. No Babinski or clonus noted bilateral.   Musculoskeletal: No  gross boney pedal deformities bilateral. No pain, crepitus, or limitation noted with foot and ankle range of motion bilateral. Muscular strength 5/5 in all groups tested bilateral.  Pain on palpation medial care tubercle of the left heel.  No pain on medial and lateral compression of calcaneus.  Gait: Unassisted, Nonantalgic.    Radiographs:  Radiographs taken today demonstrate nice correction of the first metatarsal second metatarsal osteotomies with internal fixation in good position.  She has soft tissue increase in density plantar fascial calcaneal insertion site on lateral view.  No fractures identified.  Assessment & Plan:   Assessment: Plantar fasciitis left.    Plan: Discussed etiology pathology and surgical therapies injected left heel today 20 mg Kenalog 5 mg Marcaine point maximal tenderness of the left heel.  Start her on methylprednisolone to be followed by meloxicam.  Discussed appropriate shoe gear stretching exercises ice therapy and shoe gear modifications.     Donnivan Villena T. [place], [place]

## 2020-01-23 NOTE — Patient Instructions (Signed)

## 2020-01-28 ENCOUNTER — Encounter: Payer: Self-pay | Admitting: *Deleted

## 2020-01-28 ENCOUNTER — Telehealth: Payer: Self-pay | Admitting: *Deleted

## 2020-01-28 DIAGNOSIS — R202 Paresthesia of skin: Secondary | ICD-10-CM

## 2020-01-28 DIAGNOSIS — R253 Fasciculation: Secondary | ICD-10-CM

## 2020-01-28 NOTE — Telephone Encounter (Signed)
Patient returned call and I reviewed Dr Gladstone Lighter message: Fasting vs non-fasting should not affect b6 level due to its long half life. Not sure why her B6 level is high if only taking 25mg  daily (4x per week). High B6 levels in blood can cause numbness / tingling / burning sensations and neuropathy pain. Could consider to hold b-complex vitamin, and use b12 vitamin only to see if levels normalize. If she wants to repeat B6 level we can setup.   ANCA test is borderline finding. Only 1 component is slightly elevated. Other tests are normal. I think this is false positive and not related to her symptoms.  She stated that she has asked others and was told B6 needs to be fasting. She also drinks green tea in am with B6 vitamin in it. She stated she is meeting her deductible but agreed she'd like to repeat B6 lab as fasting.  She asked about B1 test result. I advised iwll call [facility] to check on it. She asked to be called back on her Direct line [phone] at work.  I called [facility], spoke with Octavia Bruckner who stated B1 result expected by tomorrow. I called patient and made her aware. She has MRI on 02/01/20, asked how soon to expect results. I advised at least a week, but she may message or call next Thurs if she would like. She Patient verbalized understanding, appreciation. She plans to come in for repeat B6 lab on 01/30/20, understands no appointment needed, arrive between 8 am -11:30 am.

## 2020-01-28 NOTE — Addendum Note (Signed)
Addended by: Florian Buff C on: 01/28/2020 11:16 AM   Modules accepted: Orders

## 2020-01-28 NOTE — Telephone Encounter (Signed)
Tried to reach patient to answer her my chart message. LVM requesting call back.

## 2020-01-30 ENCOUNTER — Other Ambulatory Visit: Payer: Self-pay

## 2020-01-30 ENCOUNTER — Other Ambulatory Visit (INDEPENDENT_AMBULATORY_CARE_PROVIDER_SITE_OTHER): Payer: Self-pay

## 2020-01-30 DIAGNOSIS — R202 Paresthesia of skin: Secondary | ICD-10-CM

## 2020-01-30 DIAGNOSIS — R253 Fasciculation: Secondary | ICD-10-CM

## 2020-01-30 DIAGNOSIS — Z0289 Encounter for other administrative examinations: Secondary | ICD-10-CM

## 2020-01-30 LAB — MULTIPLE MYELOMA PANEL, SERUM
Albumin SerPl Elph-Mcnc: 4 g/dL (ref 2.9–4.4)
Albumin/Glob SerPl: 1.3 (ref 0.7–1.7)
Alpha 1: 0.2 g/dL (ref 0.0–0.4)
Alpha2 Glob SerPl Elph-Mcnc: 0.7 g/dL (ref 0.4–1.0)
B-Globulin SerPl Elph-Mcnc: 1.1 g/dL (ref 0.7–1.3)
Gamma Glob SerPl Elph-Mcnc: 1.1 g/dL (ref 0.4–1.8)
Globulin, Total: 3.1 g/dL (ref 2.2–3.9)
IgA/Immunoglobulin A, Serum: 257 mg/dL (ref 87–352)
IgG (Immunoglobin G), Serum: 1016 mg/dL (ref 586–1602)
IgM (Immunoglobulin M), Srm: 116 mg/dL (ref 26–217)
Total Protein: 7.1 g/dL (ref 6.0–8.5)

## 2020-01-30 LAB — PAN-ANCA
ANCA Proteinase 3: 3.9 U/mL — ABNORMAL HIGH (ref 0.0–3.5)
Atypical pANCA: <1:20 {titer}
C-ANCA: <1:20 {titer}
Myeloperoxidase Ab: <9 U/mL (ref 0.0–9.0)
P-ANCA: <1:20 {titer}

## 2020-01-30 LAB — C-REACTIVE PROTEIN: CRP: <1 mg/L (ref 0–10)

## 2020-01-30 LAB — SJOGREN'S SYNDROME ANTIBODS(SSA + SSB)
ENA SSA (RO) Ab: <0.2 AI (ref 0.0–0.9)
ENA SSB (LA) Ab: <0.2 AI (ref 0.0–0.9)

## 2020-01-30 LAB — ANA W/REFLEX: ANA Titer 1: NEGATIVE

## 2020-01-30 LAB — VITAMIN B1: Thiamine: 142.9 nmol/L (ref 66.5–200.0)

## 2020-01-30 LAB — SEDIMENTATION RATE: Sed Rate: 4 mm/hr (ref 0–40)

## 2020-01-30 LAB — VITAMIN B6: Vitamin B6: 137.3 ug/L — ABNORMAL HIGH (ref 2.0–32.8)

## 2020-02-01 ENCOUNTER — Ambulatory Visit
Admission: RE | Admit: 2020-02-01 | Discharge: 2020-02-01 | Disposition: A | Discharge status: Home / Self Care | Payer: PRIVATE HEALTH INSURANCE | Source: Ambulatory Visit | Attending: Diagnostic Neuroimaging | Admitting: Diagnostic Neuroimaging

## 2020-02-01 ENCOUNTER — Other Ambulatory Visit: Payer: Self-pay

## 2020-02-01 DIAGNOSIS — R200 Anesthesia of skin: Secondary | ICD-10-CM

## 2020-02-01 DIAGNOSIS — R253 Fasciculation: Secondary | ICD-10-CM

## 2020-02-01 MED ORDER — GADOBENATE DIMEGLUMINE 529 MG/ML IV SOLN
10.0000 mL | Freq: Once | INTRAVENOUS | Status: AC | PRN
  Administered 2020-02-01: 10 mL via INTRAVENOUS

## 2020-02-04 ENCOUNTER — Encounter: Payer: Self-pay | Admitting: *Deleted

## 2020-02-05 LAB — VITAMIN B6: Vitamin B6: 23.3 ug/L (ref 2.0–32.8)

## 2020-02-09 ENCOUNTER — Encounter: Payer: Self-pay | Admitting: *Deleted

## 2020-03-09 ENCOUNTER — Ambulatory Visit: Payer: No Typology Code available for payment source | Admitting: Podiatry

## 2020-07-06 ENCOUNTER — Other Ambulatory Visit: Payer: Self-pay | Admitting: Family Medicine

## 2020-07-06 DIAGNOSIS — Z1231 Encounter for screening mammogram for malignant neoplasm of breast: Secondary | ICD-10-CM

## 2020-09-02 ENCOUNTER — Other Ambulatory Visit: Payer: Self-pay

## 2020-09-02 ENCOUNTER — Ambulatory Visit
Admission: RE | Admit: 2020-09-02 | Discharge: 2020-09-02 | Disposition: A | Discharge status: Home / Self Care | Payer: PRIVATE HEALTH INSURANCE | Source: Ambulatory Visit | Attending: Family Medicine | Admitting: Family Medicine

## 2020-09-02 DIAGNOSIS — Z1231 Encounter for screening mammogram for malignant neoplasm of breast: Secondary | ICD-10-CM

## 2020-10-13 ENCOUNTER — Telehealth: Payer: Self-pay

## 2020-10-13 NOTE — Telephone Encounter (Signed)
Faxed Records over to [facility]

## 2020-10-27 ENCOUNTER — Encounter: Payer: Self-pay | Admitting: *Deleted

## 2020-10-27 ENCOUNTER — Encounter: Payer: Self-pay | Admitting: Cardiology

## 2020-10-27 ENCOUNTER — Ambulatory Visit (INDEPENDENT_AMBULATORY_CARE_PROVIDER_SITE_OTHER): Payer: PRIVATE HEALTH INSURANCE | Admitting: Cardiology

## 2020-10-27 ENCOUNTER — Other Ambulatory Visit: Payer: Self-pay

## 2020-10-27 ENCOUNTER — Ambulatory Visit (INDEPENDENT_AMBULATORY_CARE_PROVIDER_SITE_OTHER): Payer: PRIVATE HEALTH INSURANCE

## 2020-10-27 VITALS — BP 150/70 | HR 63 | Ht 65.0 in | Wt 112.0 lb

## 2020-10-27 DIAGNOSIS — R011 Cardiac murmur, unspecified: Secondary | ICD-10-CM | POA: Diagnosis not present

## 2020-10-27 DIAGNOSIS — R002 Palpitations: Secondary | ICD-10-CM

## 2020-10-27 DIAGNOSIS — Z8249 Family history of ischemic heart disease and other diseases of the circulatory system: Secondary | ICD-10-CM | POA: Diagnosis not present

## 2020-10-27 NOTE — Progress Notes (Signed)
Patient ID: Suzanne Marks, female   DOB: 12-08-1965, 55 y.o.   MRN: [id number] Patient enrolled for [employer] to ship a 14 day ZIO XT monitor to her home.

## 2020-10-27 NOTE — Patient Instructions (Signed)
Medication Instructions:  The current medical regimen is effective;  continue present plan and medications.  *If you need a refill on your cardiac medications before your next appointment, please call your pharmacy*  Testing/Procedures: Your physician has requested that you have an echocardiogram. Echocardiography is a painless test that uses sound waves to create images of your heart. It provides your doctor with information about the size and shape of your heart and how well your heart's chambers and valves are working. This procedure takes approximately one hour. There are no restrictions for this procedure.  ZIO XT- Long Term Monitor Instructions   Your physician has requested you wear your ZIO patch monitor 14 days.   This is a single patch monitor.  Irhythm supplies one patch monitor per enrollment.  Additional stickers are not available.   Please do not apply patch if you will be having a Nuclear Stress Test, Echocardiogram, Cardiac CT, MRI, or Chest Xray during the time frame you would be wearing the monitor. The patch cannot be worn during these tests.  You cannot remove and re-apply the ZIO XT patch monitor.   Your ZIO patch monitor will be sent USPS Priority mail from [facility] directly to your home address. The monitor may also be mailed to a PO BOX if home delivery is not available.   It may take 3-5 days to receive your monitor after you have been enrolled.   Once you have received you monitor, please review enclosed instructions.  Your monitor has already been registered assigning a specific monitor serial # to you.   Applying the monitor   Shave hair from upper left chest.   Hold abrader disc by orange tab.  Rub abrader in 40 strokes over left upper chest as indicated in your monitor instructions.   Clean area with 4 enclosed alcohol pads .  Use all pads to assure are is cleaned thoroughly.  Let dry.   Apply patch as indicated in monitor instructions.  Patch  will be place under collarbone on left side of chest with arrow pointing upward.   Rub patch adhesive wings for 2 minutes.Remove white label marked "1".  Remove white label marked "2".  Rub patch adhesive wings for 2 additional minutes.   While looking in a mirror, press and release button in center of patch.  A small green light will flash 3-4 times .  This will be your only indicator the monitor has been turned on.     Do not shower for the first 24 hours.  You may shower after the first 24 hours.   Press button if you feel a symptom. You will hear a small click.  Record Date, Time and Symptom in the Patient Log Book.   When you are ready to remove patch, follow instructions on last 2 pages of Patient Log Book.  Stick patch monitor onto last page of Patient Log Book.   Place Patient Log Book in [place] box.  Use locking tab on box and tape box closed securely.  The Orange and [employer] has [employer] on it.  Please place in mailbox as soon as possible.  Your physician should have your test results approximately 7 days after the monitor has been mailed back to [facility].   Call [place] at [phone] if you have questions regarding your ZIO XT patch monitor.  Call them immediately if you see an orange light blinking on your monitor.   If your monitor falls off in  less than 4 days contact our Monitor department at [phone].  If your monitor becomes loose or falls off after 4 days call [employer] at [phone] for suggestions on securing your monitor.   Your physician has requested that you have Coronary Calcium score which is completed by computed tomography (CT).  CT is a painless test that uses an x-ray machine to take clear, detailed pictures of your heart. The testing is completed here and the cost is $99.  Follow-Up: At [facility], you and your health needs are our priority.  As part of our continuing mission to provide you with exceptional heart care,  we have created designated Provider Care Teams.  These Care Teams include your primary Cardiologist (physician) and Advanced Practice Providers (APPs -  Physician Assistants and Nurse Practitioners) who all work together to provide you with the care you need, when you need it.  We recommend signing up for the patient portal called "MyChart".  Sign up information is provided on this After Visit Summary.  MyChart is used to connect with patients for Virtual Visits (Telemedicine).  Patients are able to view lab/test results, encounter notes, upcoming appointments, etc.  Non-urgent messages can be sent to your provider as well.   To learn more about what you can do with MyChart, go to [email or web address].    Your next appointment:   Follow up will be based on the results of the above testing.  Thank you for choosing [facility]!!

## 2020-10-27 NOTE — Progress Notes (Signed)
Cardiology Office Note:    Date:  10/27/2020   ID:  Suzanne Marks, DOB 06/12/1966, MRN [id number]  PCP:  Suzanne Marks, [place]  Cardiologist:  No primary care provider on file.  Advanced Practice Provider:  No care team member to display Electrophysiologist:  None       Referring MD: Suzanne Jordan, MD     History of Present Illness:    Suzanne Marks is a 55 y.o. female here for evaluation of chest pain and palpitations.  She was seen by Dr. Cheron Marks on 09/14/2020.  Has been quite active walking about 7 miles every day, Peloton.  She feels some exertional palpitations however.  Her blood pressure has been higher in the doctor's office however at home it usually 120/80.  Heart rates can be in the 40s to 50s when resting at times.  This seems to be recurrent for the past several years no shortness of breath no syncope no lightheadedness.  Non-smoker.  She works at [employer] at [employer].  10 years ago mother was ill and she had anxiety, palps. Happened 1-3 times per years.  Hot flashes at night, went off hormones. 2 days later felt racing , skips.   Once during [place], felt heart racing.  This worried her, she has not gotten back on since.  She is also had cold hands notably over the last several months that she had noticed in the past  Father's side (79) - AFIB. Both GF MI 57 died. Uncle 77 died CAD. Aunt aortic rupture but still alive.   Past Medical History:  Diagnosis Date  . Anemia   . Anxiety   . Bell's palsy 2000  . Carpal tunnel syndrome    both wrists  . Carpal tunnel syndrome   . Chest discomfort   . Depression   . Fasciculation   . GERD (gastroesophageal reflux disease)   . Low ferritin level    no iron taken  . Palpitations   . Paresthesias   . White coat syndrome with diagnosis of hypertension     Past Surgical History:  Procedure Laterality Date  . BLADDER REPAIR  09/20/2016   Procedure: BLADDER REPAIR;  Surgeon:  Janyth Pupa, DO;  Location: [facility];  Service: Gynecology;;  . Franki [place] STUDY N/A 01/21/2014   Procedure: BRAVO [place];  Surgeon: Arta Silence, MD;  Location: [facility];  Service: Endoscopy;  Laterality: N/A;  . ESOPHAGOGASTRODUODENOSCOPY (EGD) WITH PROPOFOL N/A 01/21/2014   Procedure: ESOPHAGOGASTRODUODENOSCOPY (EGD) WITH PROPOFOL;  Surgeon: Arta Silence, MD;  Location: [facility];  Service: Endoscopy;  Laterality: N/A;  . EXTERNAL EAR SURGERY     age 40  . EYE SURGERY    . FOOT SURGERY  2012   left  . GUM SURGERY  2011  . LAPAROSCOPIC VAGINAL HYSTERECTOMY WITH SALPINGECTOMY Bilateral 09/20/2016   Procedure: LAPAROSCOPIC ASSISTED VAGINAL HYSTERECTOMY WITH SALPINGECTOMY;  Surgeon: Janyth Pupa, DO;  Location: [facility];  Service: Gynecology;  Laterality: Bilateral;  . NOVASURE ABLATION  03/2006  . REFRACTIVE SURGERY Bilateral    lasik  . WISDOM TOOTH EXTRACTION  1990    Current Medications: Current Meds  Medication Sig  . b complex vitamins tablet Take 1 tablet by mouth. Takes 1 MWF  . Cholecalciferol (VITAMIN D) 2000 units tablet Take 2,000 Units by mouth daily.  Marland Kitchen co-enzyme Q-10 30 MG capsule Take 30 mg by mouth daily.  . Digestive Enzyme CAPS Take 1 capsule by mouth  3 (three) times daily with meals as needed.   . Iron-FA-B Cmp-C-Biot-Probiotic (FUSION PLUS PO) Take 1 capsule by mouth daily.  Marland Kitchen MAGNESIUM PO Take 300 mg by mouth daily.  Marland Kitchen OVER THE COUNTER MEDICATION Turmeric Curcumin w/ blk pepper with breakfast.  . OVER THE COUNTER MEDICATION One Metagenics Ultra flora Blance Probiotic when wakes up  . UNABLE TO FIND Med Name: algae based omega 3, 2 pills daily     Allergies:   Lanolin   Social History   Socioeconomic History  . Marital status: Married    Spouse name: Suzanne Marks  . Number of children: 2  . Years of education: College  . Highest education level: Not on file  Occupational History    Employer: [employer]  Tobacco Use  . Smoking status:  Never Smoker  . Smokeless tobacco: Never Used  Substance and Sexual Activity  . Alcohol use: Yes    Comment: Rare  . Drug use: No  . Sexual activity: Yes    Birth control/protection: Other-see comments, Surgical    Comment: vasectomy  Other Topics Concern  . Not on file  Social History Narrative   Patient lives at home with her family.  Works at [employer].  2 Children.     Caffeine Use: occasionally (iced tea)   Social Determinants of Health   Financial Resource Strain: Not on file  Food Insecurity: Not on file  Transportation Needs: Not on file  Physical Activity: Not on file  Stress: Not on file  Social Connections: Not on file     Family History: The patient's family history includes Cancer in her mother; Depression in her maternal grandmother; Heart Problems in her maternal grandfather, maternal grandmother, paternal grandfather, and paternal grandmother; Hypertension in her father; Parkinsonism in her maternal grandmother; Seizures in her mother.  ROS:   Please see the history of present illness.    No fevers chills nausea vomiting syncope All other systems reviewed and are negative.  EKGs/Labs/Other Studies Reviewed:    The following studies were reviewed today: Office visit from Dr. Stephanie Acre reviewed.  EKG:  EKG is  ordered today.  The ekg ordered today demonstrates sinus bradycardia 63 no other abnormalities  Recent Labs: No results found for requested labs within last 8760 hours.  Recent Lipid Panel    Component Value Date/Time   CHOL 188 02/27/2013 1501   TRIG 66 02/27/2013 1501   HDL 75 02/27/2013 1501   CHOLHDL 2.5 02/27/2013 1501   VLDL 13 02/27/2013 1501   LDLCALC 100 (H) 02/27/2013 1501     Risk Assessment/Calculations:      Physical Exam:    VS:  BP (!) 150/70 (BP Location: Left Arm, Patient Position: Sitting, Cuff Size: Normal)   Pulse 63   Ht 5\' 5"  (1.651 m)   Wt 112 lb (50.8 kg)   LMP 07/01/2016   SpO2 97%   BMI 18.64 kg/m      Wt Readings from Last 3 Encounters:  10/27/20 112 lb (50.8 kg)  01/20/20 117 lb 12.8 oz (53.4 kg)  04/13/17 115 lb 6.4 oz (52.3 kg)     GEN:  Well nourished, well developed in no acute distress HEENT: Normal NECK: No JVD; No carotid bruits LYMPHATICS: No lymphadenopathy CARDIAC: RRR, potential midsystolic click/murmur, no rubs, gallops RESPIRATORY:  Clear to auscultation without rales, wheezing or rhonchi  ABDOMEN: Soft, non-tender, non-distended MUSCULOSKELETAL:  No edema; No deformity  SKIN: Warm and dry NEUROLOGIC:  Alert and oriented x  3 PSYCHIATRIC:  Normal affect   ASSESSMENT:    1. Murmur   2. Palpitations   3. Family history of early CAD    PLAN:    In order of problems listed above:  Palpitations -Could be PACs or PVCs.  We will go ahead and check a Zio patch monitor for 14 days.  I would like her to do her Peloton.  I want to make sure that there is no evidence of atrial fibrillation present.  No other adverse arrhythmias.  Continue to avoid excessive caffeine.  Sleep hygiene.  Midsystolic click/murmur -I will go ahead and check an echocardiogram to ensure proper structure and function of her heart to exclude mitral valve prolapse.  Family history of CAD -We will check a calcium score, $99.  If present, we will consider Crestor in addition to her excellent exercise and mostly plant-based diet.  She is working on decreasing her sugars, supplant with dark chocolate for instance.    Medication Adjustments/Labs and Tests Ordered: Current medicines are reviewed at length with the patient today.  Concerns regarding medicines are outlined above.  Orders Placed This Encounter  Procedures  . CT CARDIAC SCORING (SELF PAY ONLY)  . LONG TERM MONITOR (3-14 DAYS)  . EKG 12-Lead  . ECHOCARDIOGRAM COMPLETE   No orders of the defined types were placed in this encounter.   Patient Instructions  Medication Instructions:  The current medical regimen is effective;   continue present plan and medications.  *If you need a refill on your cardiac medications before your next appointment, please call your pharmacy*  Testing/Procedures: Your physician has requested that you have an echocardiogram. Echocardiography is a painless test that uses sound waves to create images of your heart. It provides your doctor with information about the size and shape of your heart and how well your heart's chambers and valves are working. This procedure takes approximately one hour. There are no restrictions for this procedure.  ZIO XT- Long Term Monitor Instructions   Your physician has requested you wear your ZIO patch monitor 14 days.   This is a single patch monitor.  Irhythm supplies one patch monitor per enrollment.  Additional stickers are not available.   Please do not apply patch if you will be having a Nuclear Stress Test, Echocardiogram, Cardiac CT, MRI, or Chest Xray during the time frame you would be wearing the monitor. The patch cannot be worn during these tests.  You cannot remove and re-apply the ZIO XT patch monitor.   Your ZIO patch monitor will be sent USPS Priority mail from [facility] directly to your home address. The monitor may also be mailed to a PO BOX if home delivery is not available.   It may take 3-5 days to receive your monitor after you have been enrolled.   Once you have received you monitor, please review enclosed instructions.  Your monitor has already been registered assigning a specific monitor serial # to you.   Applying the monitor   Shave hair from upper left chest.   Hold abrader disc by orange tab.  Rub abrader in 40 strokes over left upper chest as indicated in your monitor instructions.   Clean area with 4 enclosed alcohol pads .  Use all pads to assure are is cleaned thoroughly.  Let dry.   Apply patch as indicated in monitor instructions.  Patch will be place under collarbone on left side of chest with arrow pointing  upward.   Rub patch adhesive  wings for 2 minutes.Remove white label marked "1".  Remove white label marked "2".  Rub patch adhesive wings for 2 additional minutes.   While looking in a mirror, press and release button in center of patch.  A small green light will flash 3-4 times .  This will be your only indicator the monitor has been turned on.     Do not shower for the first 24 hours.  You may shower after the first 24 hours.   Press button if you feel a symptom. You will hear a small click.  Record Date, Time and Symptom in the Patient Log Book.   When you are ready to remove patch, follow instructions on last 2 pages of Patient Log Book.  Stick patch monitor onto last page of Patient Log Book.   Place Patient Log Book in [place] box.  Use locking tab on box and tape box closed securely.  The [employer] and [employer] has [employer] on it.  Please place in mailbox as soon as possible.  Your physician should have your test results approximately 7 days after the monitor has been mailed back to [facility].   Call [place] at [phone] if you have questions regarding your ZIO XT patch monitor.  Call them immediately if you see an orange light blinking on your monitor.   If your monitor falls off in less than 4 days contact our Monitor department at [phone].  If your monitor becomes loose or falls off after 4 days call [employer] at [phone] for suggestions on securing your monitor.   Your physician has requested that you have Coronary Calcium score which is completed by computed tomography (CT).  CT is a painless test that uses an x-ray machine to take clear, detailed pictures of your heart. The testing is completed here and the cost is $99.  Follow-Up: At [facility], you and your health needs are our priority.  As part of our continuing mission to provide you with exceptional heart care, we have created designated Provider Care Teams.  These Care Teams  include your primary Cardiologist (physician) and Advanced Practice Providers (APPs -  Physician Assistants and Nurse Practitioners) who all work together to provide you with the care you need, when you need it.  We recommend signing up for the patient portal called "MyChart".  Sign up information is provided on this After Visit Summary.  MyChart is used to connect with patients for Virtual Visits (Telemedicine).  Patients are able to view lab/test results, encounter notes, upcoming appointments, etc.  Non-urgent messages can be sent to your provider as well.   To learn more about what you can do with MyChart, go to [email or web address].    Your next appointment:   Follow up will be based on the results of the above testing.  Thank you for choosing [facility]!!         Signed, Candee Furbish, MD  10/27/2020 3:07 PM    [place]

## 2020-10-29 DIAGNOSIS — R002 Palpitations: Secondary | ICD-10-CM | POA: Diagnosis not present

## 2020-11-15 ENCOUNTER — Ambulatory Visit (INDEPENDENT_AMBULATORY_CARE_PROVIDER_SITE_OTHER)
Admission: RE | Admit: 2020-11-15 | Discharge: 2020-11-15 | Disposition: A | Discharge status: Home / Self Care | Payer: Self-pay | Source: Ambulatory Visit | Attending: Cardiology | Admitting: Cardiology

## 2020-11-15 ENCOUNTER — Other Ambulatory Visit: Payer: Self-pay

## 2020-11-15 ENCOUNTER — Ambulatory Visit (HOSPITAL_COMMUNITY): Payer: PRIVATE HEALTH INSURANCE | Attending: Cardiology

## 2020-11-15 DIAGNOSIS — R002 Palpitations: Secondary | ICD-10-CM | POA: Diagnosis present

## 2020-11-15 DIAGNOSIS — R011 Cardiac murmur, unspecified: Secondary | ICD-10-CM | POA: Diagnosis not present

## 2020-11-15 DIAGNOSIS — Z8249 Family history of ischemic heart disease and other diseases of the circulatory system: Secondary | ICD-10-CM

## 2020-11-15 LAB — ECHOCARDIOGRAM COMPLETE
Area-P 1/2: 3.65 cm2
S' Lateral: 2.5 cm

## 2020-11-23 ENCOUNTER — Other Ambulatory Visit: Payer: Self-pay | Admitting: Family Medicine

## 2020-11-23 DIAGNOSIS — K7689 Other specified diseases of liver: Secondary | ICD-10-CM

## 2020-11-24 ENCOUNTER — Telehealth: Payer: Self-pay | Admitting: *Deleted

## 2020-11-24 DIAGNOSIS — R9431 Abnormal electrocardiogram [ECG] [EKG]: Secondary | ICD-10-CM

## 2020-11-24 DIAGNOSIS — R001 Bradycardia, unspecified: Secondary | ICD-10-CM

## 2020-11-24 DIAGNOSIS — I471 Supraventricular tachycardia: Secondary | ICD-10-CM

## 2020-11-24 NOTE — Telephone Encounter (Signed)
Spoke with pt to review her zio monitor results:   Rate 58 bpm, sinus rhythm. HR 35 BPM at 11:30pm presumably during sleep  She had 53 separate episodes of supraventricular tachycardia, longest lasting 18 beats at 95 bpm. Fastest was 200 bpm lasting 5 beats. Occasional symptoms surrounding these episodes.  Rare PVCs, PACs.   And Dr Marlou Porch recommendation/orders: Given her periods of bradycardia, with symptomatic PSVT, I would like for her to try flecainide 50 mg twice a day to see if we can help suppress the atrial arrhythmia. Lets go ahead and start. Have her follow up with ECG in 2 weeks after starting.  Suzanne Furbish, MD  Pt is hesitant to start Flecainide with the possible side effects especially possible low blood pressure.  At times her BP is 100/60 and she is concerned the medication my make it lower.  She reports recently when she went to bed she had an episode where she felt her heart racing and didn't feel "right"  She took her BP which was 120/100 and HR was 100 bpm.  This lasted appr 1 hours.  She did take a Xanax 0.25 mg and her VS slowly returned to normal.  Advised this is what the Flecainide would help to prevent from occurring but again starting a new medication makes her nervous.  Advised I will have Dr Marlou Porch review her concerns and determine if there is possibly another approach that could be taken.  She is aware once he has reviewed this information she will be called back with further instructions.  She will continue to take her Xanax if this happens again as she feels like she is having a panic attack.

## 2020-11-25 NOTE — Telephone Encounter (Signed)
Thank you for the update. Lets go ahead and refer to electrophysiology for alternative treatments.  Thank you. Candee Furbish, MD

## 2020-11-26 NOTE — Telephone Encounter (Signed)
Called and spoke with pt regarding her concerns.  Advised Dr Marlou Porch has ordered for her to be referred to EP for further treatment options.  She would like to be seen ASAP as she is quite nervous about all of this.  Reassurance given.  She is aware she will be called to be scheduled with EP here at our office.  She was grateful for the call back and information.

## 2020-11-26 NOTE — Telephone Encounter (Signed)
Good morning.  I received a call from one of the nurses on Wednesday the 4th about Dr. Marlou Porch recommendation of medicine after viewing my study result.  I relayed my concerns and have not heard back. I still have a lot of questions and am very nervous about taking a medication that sounds very serious.  I also noticed that my medications are  not correct in my chart.  I don't take any prescribed medications except xanax every once in awhile but I do take a lot of supplements that need to be updated and reviewed.  Is there any way that Dr. Marlou Porch or someone could call me today?  I really appreciate your help.  Thank you,  Suzanne Marks

## 2020-11-29 ENCOUNTER — Ambulatory Visit
Admission: RE | Admit: 2020-11-29 | Discharge: 2020-11-29 | Disposition: A | Discharge status: Home / Self Care | Payer: PRIVATE HEALTH INSURANCE | Source: Ambulatory Visit | Attending: Family Medicine | Admitting: Family Medicine

## 2020-11-29 DIAGNOSIS — K7689 Other specified diseases of liver: Secondary | ICD-10-CM

## 2020-12-01 ENCOUNTER — Other Ambulatory Visit (HOSPITAL_COMMUNITY): Payer: PRIVATE HEALTH INSURANCE

## 2020-12-01 ENCOUNTER — Inpatient Hospital Stay: Admission: RE | Admit: 2020-12-01 | Payer: PRIVATE HEALTH INSURANCE | Source: Ambulatory Visit

## 2020-12-06 ENCOUNTER — Other Ambulatory Visit (HOSPITAL_COMMUNITY): Payer: Self-pay | Admitting: Sports Medicine

## 2020-12-06 ENCOUNTER — Other Ambulatory Visit: Payer: PRIVATE HEALTH INSURANCE

## 2020-12-06 ENCOUNTER — Ambulatory Visit (HOSPITAL_COMMUNITY)
Admission: RE | Admit: 2020-12-06 | Discharge: 2020-12-06 | Disposition: A | Discharge status: Home / Self Care | Payer: PRIVATE HEALTH INSURANCE | Source: Ambulatory Visit | Attending: Sports Medicine | Admitting: Sports Medicine

## 2020-12-06 ENCOUNTER — Other Ambulatory Visit: Payer: Self-pay | Admitting: Gastroenterology

## 2020-12-06 ENCOUNTER — Other Ambulatory Visit: Payer: Self-pay

## 2020-12-06 DIAGNOSIS — M79604 Pain in right leg: Secondary | ICD-10-CM

## 2020-12-06 DIAGNOSIS — M7989 Other specified soft tissue disorders: Secondary | ICD-10-CM | POA: Insufficient documentation

## 2020-12-07 ENCOUNTER — Other Ambulatory Visit: Payer: Self-pay | Admitting: Gastroenterology

## 2020-12-07 DIAGNOSIS — K7689 Other specified diseases of liver: Secondary | ICD-10-CM

## 2020-12-13 ENCOUNTER — Other Ambulatory Visit: Payer: Self-pay

## 2020-12-13 ENCOUNTER — Encounter: Payer: Self-pay | Admitting: Cardiology

## 2020-12-13 ENCOUNTER — Ambulatory Visit (INDEPENDENT_AMBULATORY_CARE_PROVIDER_SITE_OTHER): Payer: PRIVATE HEALTH INSURANCE | Admitting: Cardiology

## 2020-12-13 VITALS — BP 136/84 | HR 68 | Ht 65.0 in | Wt 114.0 lb

## 2020-12-13 DIAGNOSIS — I471 Supraventricular tachycardia: Secondary | ICD-10-CM | POA: Diagnosis not present

## 2020-12-13 MED ORDER — DILTIAZEM HCL ER COATED BEADS 120 MG PO CP24: 120.0000 mg | ORAL_CAPSULE | Freq: Every day | ORAL | 3 refills | Status: DC

## 2020-12-13 NOTE — Progress Notes (Signed)
Electrophysiology Office Note   Date:  12/13/2020   ID:  [place], Labreck [birth year], MRN [id number]  PCP:  Jonathon Jordan, MD  Cardiologist:  Marlou Porch Primary Electrophysiologist:  Solstice Lastinger Meredith Leeds, MD    Chief Complaint: palpitations   History of Present Illness: CHENILLE TOOR is a 55 y.o. female who is being seen today for the evaluation of SVT at the request of Jerline Pain, MD. Presenting today for electrophysiology evaluation.  She initially presented to cardiology clinic for evaluation of chest pain and palpitations.  She had exertional palpitations.  These have been recurrent for the past several years.  10 years ago, she had palpitations when her mother was ill associated with anxiety.  She uses a Interior and spatial designer and is noted at times heart racing.  She states that she has heart racing at many other times, not always related to exertion.  She is most often able to do all of her daily activities without restriction.  Today, she denies symptoms of palpitations, chest pain, shortness of breath, orthopnea, PND, lower extremity edema, claudication, dizziness, presyncope, syncope, bleeding, or neurologic sequela. The patient is tolerating medications without difficulties.    Past Medical History:  Diagnosis Date  . Anemia   . Anxiety   . Bell's palsy 2000  . Carpal tunnel syndrome    both wrists  . Carpal tunnel syndrome   . Chest discomfort   . Depression   . Fasciculation   . GERD (gastroesophageal reflux disease)   . Low ferritin level    no iron taken  . Palpitations   . Paresthesias   . White coat syndrome with diagnosis of hypertension    Past Surgical History:  Procedure Laterality Date  . BLADDER REPAIR  09/20/2016   Procedure: BLADDER REPAIR;  Surgeon: Janyth Pupa, DO;  Location: [facility];  Service: Gynecology;;  . Franki Monte [place] STUDY N/A 01/21/2014   Procedure: BRAVO [place];  Surgeon: Arta Silence, MD;  Location: [facility];  Service: Endoscopy;  Laterality:  N/A;  . ESOPHAGOGASTRODUODENOSCOPY (EGD) WITH PROPOFOL N/A 01/21/2014   Procedure: ESOPHAGOGASTRODUODENOSCOPY (EGD) WITH PROPOFOL;  Surgeon: Arta Silence, MD;  Location: [facility];  Service: Endoscopy;  Laterality: N/A;  . EXTERNAL EAR SURGERY     age 64  . EYE SURGERY    . FOOT SURGERY  2012   left  . GUM SURGERY  2011  . LAPAROSCOPIC VAGINAL HYSTERECTOMY WITH SALPINGECTOMY Bilateral 09/20/2016   Procedure: LAPAROSCOPIC ASSISTED VAGINAL HYSTERECTOMY WITH SALPINGECTOMY;  Surgeon: Janyth Pupa, DO;  Location: [facility];  Service: Gynecology;  Laterality: Bilateral;  . NOVASURE ABLATION  03/2006  . REFRACTIVE SURGERY Bilateral    lasik  . WISDOM TOOTH EXTRACTION  1990     Current Outpatient Medications  Medication Sig Dispense Refill  . b complex vitamins tablet Take 1 tablet by mouth. Takes 1 MWF    . Cholecalciferol (VITAMIN D) 2000 units tablet Take 2,000 Units by mouth daily.    Marland Kitchen co-enzyme Q-10 30 MG capsule Take 30 mg by mouth daily.    . Digestive Enzyme CAPS Take 1 capsule by mouth 3 (three) times daily with meals as needed.     . diltiazem (CARDIZEM CD) 120 MG 24 hr capsule Take 1 capsule (120 mg total) by mouth daily. 30 capsule 3  . Iron-FA-B Cmp-C-Biot-Probiotic (FUSION PLUS PO) Take 1 capsule by mouth daily.    Marland Kitchen MAGNESIUM PO Take 300 mg by mouth daily.    Marland Kitchen OVER THE COUNTER MEDICATION  Turmeric Curcumin w/ blk pepper with breakfast.    . OVER THE COUNTER MEDICATION One Metagenics Ultra flora Blance Probiotic when wakes up    . UNABLE TO FIND Med Name: algae based omega 3, 2 pills daily     No current facility-administered medications for this visit.    Allergies:   Lanolin   Social History:  The patient  reports that she has never smoked. She has never used smokeless tobacco. She reports current alcohol use. She reports that she does not use drugs.   Family History:  The patient's family history includes Cancer in her mother; Depression in her maternal grandmother;  Heart Problems in her maternal grandfather, maternal grandmother, paternal grandfather, and paternal grandmother; Hypertension in her father; Parkinsonism in her maternal grandmother; Seizures in her mother.    ROS:  Please see the history of present illness.   Otherwise, review of systems is positive for none.   All other systems are reviewed and negative.    PHYSICAL EXAM: VS:  BP 136/84   Pulse 68   Ht 5\' 5"  (1.651 m)   Wt 114 lb (51.7 kg)   LMP 07/01/2016   SpO2 99%   BMI 18.97 kg/m  , BMI Body mass index is 18.97 kg/m. GEN: Well nourished, well developed, in no acute distress  HEENT: normal  Neck: no JVD, carotid bruits, or masses Cardiac: RRR; no murmurs, rubs, or gallops,no edema  Respiratory:  clear to auscultation bilaterally, normal work of breathing GI: soft, nontender, nondistended, + BS MS: no deformity or atrophy  Skin: warm and dry Neuro:  Strength and sensation are intact Psych: euthymic mood, full affect  EKG:  EKG is not ordered today. Personal review of the ekg ordered 10/27/20 shows sinus rhythm, rate 63  Recent Labs: No results found for requested labs within last 8760 hours.    Lipid Panel     Component Value Date/Time   CHOL 188 02/27/2013 1501   TRIG 66 02/27/2013 1501   HDL 75 02/27/2013 1501   CHOLHDL 2.5 02/27/2013 1501   VLDL 13 02/27/2013 1501   LDLCALC 100 (H) 02/27/2013 1501     Wt Readings from Last 3 Encounters:  12/13/20 114 lb (51.7 kg)  10/27/20 112 lb (50.8 kg)  01/20/20 117 lb 12.8 oz (53.4 kg)      Other studies Reviewed: Additional studies/ records that were reviewed today include: TTE 11/15/20  Review of the above records today demonstrates:  1. Left ventricular ejection fraction, by estimation, is 60 to 65%. The  left ventricle has normal function. The left ventricle has no regional  wall motion abnormalities. Left ventricular diastolic parameters were  normal.  2. Right ventricular systolic function is normal. The  right ventricular  size is normal. There is normal pulmonary artery systolic pressure. The  estimated right ventricular systolic pressure is 93.7 mmHg.  3. The mitral valve is normal in structure. Trivial mitral valve  regurgitation.  4. The aortic valve is tricuspid. Aortic valve regurgitation is mild. No  aortic stenosis is present.  5. The inferior vena cava is normal in size with greater than 50%  respiratory variability, suggesting right atrial pressure of 3 mmHg.   Cardiac monitor 11/24/2020 personally reviewed  Rate 58 bpm, sinus rhythm. HR 35 BPM at 11:30pm presumably during sleep  She had 53 separate episodes of supraventricular tachycardia, longest lasting 18 beats at 95 bpm. Fastest was 200 bpm lasting 5 beats. Occasional symptoms surrounding these episodes.  Rare PVCs, PACs.  ASSESSMENT AND PLAN:  1.  SVT/PVCs: Fortunately all of her episodes of SVT are quite slow and her PVC burden is less than 1%.  She does have symptoms that appear to be correlating with both of these.  Despite that, with her low burden, I do not think that ablation would be a reasonable strategy.  She would like to try to avoid medications, though if she does have symptoms, we Matvey Llanas give her a prescription for diltiazem 120 mg.  I Carlen Fils see her back in 3 months.  Case discussed with referring cardiologist  Current medicines are reviewed at length with the patient today.   The patient does not have concerns regarding her medicines.  The following changes were made today: Diltiazem  Labs/ tests ordered today include:  No orders of the defined types were placed in this encounter.    Disposition:   FU with Rani Idler 3 months  Signed, Ellamae Lybeck Meredith Leeds, MD  12/13/2020 1:54 PM     [address] [place] [phone] (office) [phone] (fax)

## 2020-12-13 NOTE — Patient Instructions (Signed)
Medication Instructions:  Your physician has recommended you make the following change in your medication:  1. START Diltiazem 120 mg once daily  *If you need a refill on your cardiac medications before your next appointment, please call your pharmacy*   Lab Work: None ordered  Testing/Procedures: None ordered   Follow-Up: At [facility], you and your health needs are our priority.  As part of our continuing mission to provide you with exceptional heart care, we have created designated Provider Care Teams.  These Care Teams include your primary Cardiologist (physician) and Advanced Practice Providers (APPs -  Physician Assistants and Nurse Practitioners) who all work together to provide you with the care you need, when you need it.   Your next appointment:   3 month(s)  The format for your next appointment:   In Person  Provider:   Allegra Lai, MD    Thank you for choosing [facility]!!   Trinidad Curet, RN [phone]   Other Instructions  Diltiazem Tablets What is this medicine? DILTIAZEM (dil TYE a zem) is a calcium channel blocker. It relaxes your blood vessels and decreases the amount of work the heart has to do. It treats and/or prevents chest pain (also called angina). This medicine may be used for other purposes; ask your health care provider or pharmacist if you have questions. COMMON BRAND NAME(S): Cardizem What should I tell my health care provider before I take this medicine? They need to know if you have any of these conditions:  heart attack  heart disease  irregular heartbeat or rhythm  low blood pressure  an unusual or allergic reaction to diltiazem, other drugs, foods, dyes, or preservatives  pregnant or trying to get pregnant  breast-feeding How should I use this medicine? Take this drug by mouth. Take it as directed on the prescription label at the same time every day. Keep taking it unless your health care provider tells you to  stop. Talk to your health care provider about the use of this drug in children. Special care may be needed. Overdosage: If you think you have taken too much of this medicine contact a poison control center or emergency room at once. NOTE: This medicine is only for you. Do not share this medicine with others. What if I miss a dose? If you miss a dose, take it as soon as you can. If it is almost time for your next dose, take only that dose. Do not take double or extra doses. What may interact with this medicine? Do not take this medicine with any of the following:  cisapride  hawthorn  pimozide  ranolazine  red yeast rice This medicine may also interact with the following medications:  buspirone  carbamazepine  cimetidine  cyclosporine  digoxin  local anesthetics or general anesthetics  lovastatin  medicines for anxiety or difficulty sleeping like midazolam and triazolam  medicines for high blood pressure or heart problems  quinidine  rifampin, rifabutin, or rifapentine This list may not describe all possible interactions. Give your health care provider a list of all the medicines, herbs, non-prescription drugs, or dietary supplements you use. Also tell them if you smoke, drink alcohol, or use illegal drugs. Some items may interact with your medicine. What should I watch for while using this medicine? Visit your care team for regular checks on your progress. Check your blood pressure as directed. Ask your care team what your blood pressure should be. Also, find out when you should contact them. Do  not treat yourself for coughs, colds, or pain while you are using this medication without asking your care team for advice. Some medications may increase your blood pressure. This medication may cause serious skin reactions. They can happen weeks to months after starting the medication. Contact your care team right away if you notice fevers or flu-like symptoms with a rash. The  rash may be red or purple and then turn into blisters or peeling of the skin. Or, you might notice a red rash with swelling of the face, lips or lymph nodes in your neck or under your arms. You may get drowsy or dizzy. Do not drive, use machinery, or do anything that needs mental alertness until you know how this medication affects you. Do not stand up or sit up quickly, especially if you are an older patient. This reduces the risk of dizzy or fainting spells. What side effects may I notice from receiving this medicine? Side effects that you should report to your doctor or health care provider as soon as possible:  allergic reactions (skin rash, itching or hives; swelling of the face, lips, or tongue)  heart failure (trouble breathing; fast, irregular heartbeat; sudden weight gain; swelling of the ankles, feet, hands; unusually weak or tired)  heartbeat rhythm changes (trouble breathing; chest pain; dizziness; fast, irregular heartbeat; feeling faint or lightheaded, falls)  liver injury (dark yellow or brown urine; general ill feeling or flu-like symptoms; loss of appetite, right upper belly pain; unusually weak or tired, yellowing of the eyes or skin)  low blood pressure (dizziness; feeling faint or lightheaded, falls; unusually weak or tired)  redness, blistering, peeling, or loosening of the skin, including inside the mouth Side effects that usually do not require medical attention (report to your doctor or health care provider if they continue or are bothersome):  changes in sex drive or performance  depressed mood  headache  sudden weight gain  nausea  trouble sleeping This list may not describe all possible side effects. Call your doctor for medical advice about side effects. You may report side effects to FDA at 1-800-FDA-1088. Where should I keep my medicine? Keep out of the reach of children and pets. Store at room temperature between 15 and 30 degrees C (59 and 86 degrees  F). Protect from moisture. Keep the container tightly closed. Throw away any unused drug after the expiration date. NOTE: This sheet is a summary. It may not cover all possible information. If you have questions about this medicine, talk to your doctor, pharmacist, or health care provider.  2021 Elsevier/Gold Standard (2020-05-27 15:46:34)

## 2020-12-18 ENCOUNTER — Ambulatory Visit
Admission: RE | Admit: 2020-12-18 | Discharge: 2020-12-18 | Disposition: A | Discharge status: Home / Self Care | Payer: PRIVATE HEALTH INSURANCE | Source: Ambulatory Visit | Attending: Gastroenterology | Admitting: Gastroenterology

## 2020-12-18 ENCOUNTER — Other Ambulatory Visit: Payer: Self-pay

## 2020-12-18 DIAGNOSIS — K7689 Other specified diseases of liver: Secondary | ICD-10-CM

## 2020-12-18 MED ORDER — GADOBENATE DIMEGLUMINE 529 MG/ML IV SOLN
10.0000 mL | Freq: Once | INTRAVENOUS | Status: AC | PRN
  Administered 2020-12-18: 10 mL via INTRAVENOUS

## 2021-01-12 ENCOUNTER — Other Ambulatory Visit: Payer: Self-pay | Admitting: Sports Medicine

## 2021-01-12 DIAGNOSIS — M545 Low back pain, unspecified: Secondary | ICD-10-CM

## 2021-01-18 ENCOUNTER — Ambulatory Visit
Admission: RE | Admit: 2021-01-18 | Discharge: 2021-01-18 | Disposition: A | Discharge status: Home / Self Care | Payer: PRIVATE HEALTH INSURANCE | Source: Ambulatory Visit | Attending: Sports Medicine | Admitting: Sports Medicine

## 2021-01-18 ENCOUNTER — Other Ambulatory Visit: Payer: Self-pay

## 2021-01-18 DIAGNOSIS — M545 Low back pain, unspecified: Secondary | ICD-10-CM

## 2021-03-17 ENCOUNTER — Ambulatory Visit: Payer: Self-pay | Admitting: Cardiology

## 2021-05-19 ENCOUNTER — Other Ambulatory Visit: Payer: Self-pay

## 2021-05-19 ENCOUNTER — Ambulatory Visit (INDEPENDENT_AMBULATORY_CARE_PROVIDER_SITE_OTHER): Payer: No Typology Code available for payment source | Admitting: Cardiology

## 2021-05-19 ENCOUNTER — Encounter: Payer: Self-pay | Admitting: Cardiology

## 2021-05-19 VITALS — BP 130/72 | HR 57 | Ht 65.0 in | Wt 117.0 lb

## 2021-05-19 DIAGNOSIS — I471 Supraventricular tachycardia: Secondary | ICD-10-CM | POA: Diagnosis not present

## 2021-05-19 NOTE — Progress Notes (Signed)
Electrophysiology Office Note   Date:  05/19/2021   ID:  Suzanne Marks, DOB 12-Aug-1965, MRN [id number]  PCP:  Suzanne Jordan, MD  Cardiologist:  Suzanne Marks Primary Electrophysiologist:  Suzanne Wendt Meredith Leeds, MD    Chief Complaint: palpitations   History of Present Illness: Suzanne Marks is a 55 y.o. female who is being seen today for the evaluation of SVT at the request of Suzanne Jordan, MD. Presenting today for electrophysiology evaluation.  She initially presented to cardiology clinic for evaluation of chest pain and palpitations.  Her palpitations were exertional.  These have been going on for the last several years.  10 years ago, she had palpitations when her mother was ill which was associated by anxiety.  She uses a Interior and spatial designer and has noted at times she has heart racing.  She states that racing is not necessarily related to exertion.  Today, denies symptoms of palpitations, chest pain, shortness of breath, orthopnea, PND, lower extremity edema, claudication, dizziness, presyncope, syncope, bleeding, or neurologic sequela. The patient is tolerating medications without difficulties.  Since being seen she has done well.  She has had no further prolonged episodes.  She has had intermittent palpitations that she understands that they are not dangerous, she is feeling well.  Her daughter is getting married this coming weekend.  She is stressed about that and feels that this may be contributing to elevated blood pressures and intermittent palpitations.   Past Medical History:  Diagnosis Date   Anemia    Anxiety    Bell's palsy 2000   Carpal tunnel syndrome    both wrists   Carpal tunnel syndrome    Chest discomfort    Depression    Fasciculation    GERD (gastroesophageal reflux disease)    Low ferritin level    no iron taken   Palpitations    Paresthesias    White coat syndrome with diagnosis of hypertension    Past Surgical History:  Procedure Laterality Date   BLADDER  REPAIR  09/20/2016   Procedure: BLADDER REPAIR;  Surgeon: Janyth Pupa, DO;  Location: [facility];  Service: Gynecology;;   Franki Monte [facility] STUDY N/A 01/21/2014   Procedure: BRAVO [place] STUDY;  Surgeon: Arta Silence, MD;  Location: [facility];  Service: Endoscopy;  Laterality: N/A;   ESOPHAGOGASTRODUODENOSCOPY (EGD) WITH PROPOFOL N/A 01/21/2014   Procedure: ESOPHAGOGASTRODUODENOSCOPY (EGD) WITH PROPOFOL;  Surgeon: Arta Silence, MD;  Location: [facility];  Service: Endoscopy;  Laterality: N/A;   EXTERNAL EAR SURGERY     age 68   EYE SURGERY     FOOT SURGERY  2012   left   GUM SURGERY  2011   LAPAROSCOPIC VAGINAL HYSTERECTOMY WITH SALPINGECTOMY Bilateral 09/20/2016   Procedure: LAPAROSCOPIC ASSISTED VAGINAL HYSTERECTOMY WITH SALPINGECTOMY;  Surgeon: Janyth Pupa, DO;  Location: [facility];  Service: Gynecology;  Laterality: Bilateral;   NOVASURE ABLATION  03/2006   REFRACTIVE SURGERY Bilateral    lasik   WISDOM TOOTH EXTRACTION  1990     Current Outpatient Medications  Medication Sig Dispense Refill   Ascorbic Acid (VITAMIN C PO) Take by mouth.     b complex vitamins tablet Take 1 tablet by mouth. Takes 1 MWF     co-enzyme Q-10 30 MG capsule Take 30 mg by mouth daily.     Digestive Enzyme CAPS Take 1 capsule by mouth 3 (three) times daily with meals as needed.      ergocalciferol (VITAMIN D2) 1.25 MG (50000 UT) capsule Take 50,000 Units by mouth  once a week.     Iron-FA-B Cmp-C-Biot-Probiotic (FUSION PLUS PO) Take 1 capsule by mouth 2 (two) times a week.     MAGNESIUM PO Take 300 mg by mouth daily.     Menaquinone-7 (K2 PO) Take by mouth.     Nutritional Supplements (DHEA PO) Take 5 mg by mouth.     OVER THE COUNTER MEDICATION Turmeric Curcumin w/ blk pepper with breakfast.     OVER THE COUNTER MEDICATION One Metagenics Ultra flora Blance Probiotic when wakes up     [place] Name: algae based omega 3, 2 pills daily     No current facility-administered medications for this visit.     Allergies:   Lanolin   Social History:  The patient  reports that she has never smoked. She has never used smokeless tobacco. She reports current alcohol use. She reports that she does not use drugs.   Family History:  The patient's family history includes Cancer in her mother; Depression in her maternal grandmother; Heart Problems in her maternal grandfather, maternal grandmother, paternal grandfather, and paternal grandmother; Hypertension in her father; Parkinsonism in her maternal grandmother; Seizures in her mother.   ROS:  Please see the history of present illness.   Otherwise, review of systems is positive for none.   All other systems are reviewed and negative.   PHYSICAL EXAM: VS:  BP 130/72   Pulse (!) 57   Ht 5\' 5"  (1.651 m)   Wt 117 lb (53.1 kg)   LMP 07/01/2016   SpO2 98%   BMI 19.47 kg/m  , BMI Body mass index is 19.47 kg/m. GEN: Well nourished, well developed, in no acute distress  HEENT: normal  Neck: no JVD, carotid bruits, or masses Cardiac: RRR; no murmurs, rubs, or gallops,no edema  Respiratory:  clear to auscultation bilaterally, normal work of breathing GI: soft, nontender, nondistended, + BS MS: no deformity or atrophy  Skin: warm and dry Neuro:  Strength and sensation are intact Psych: euthymic mood, full affect  EKG:  EKG is ordered today. Personal review of the ekg ordered shows sinus rhythm, rate 57  Recent Labs: No results found for requested labs within last 8760 hours.    Lipid Panel     Component Value Date/Time   CHOL 188 02/27/2013 1501   TRIG 66 02/27/2013 1501   HDL 75 02/27/2013 1501   CHOLHDL 2.5 02/27/2013 1501   VLDL 13 02/27/2013 1501   LDLCALC 100 (H) 02/27/2013 1501     Wt Readings from Last 3 Encounters:  05/19/21 117 lb (53.1 kg)  12/13/20 114 lb (51.7 kg)  10/27/20 112 lb (50.8 kg)      Other studies Reviewed: Additional studies/ records that were reviewed today include: TTE 11/15/20  Review of the above  records today demonstrates:   1. Left ventricular ejection fraction, by estimation, is 60 to 65%. The  left ventricle has normal function. The left ventricle has no regional  wall motion abnormalities. Left ventricular diastolic parameters were  normal.   2. Right ventricular systolic function is normal. The right ventricular  size is normal. There is normal pulmonary artery systolic pressure. The  estimated right ventricular systolic pressure is 53.6 mmHg.   3. The mitral valve is normal in structure. Trivial mitral valve  regurgitation.   4. The aortic valve is tricuspid. Aortic valve regurgitation is mild. No  aortic stenosis is present.   5. The inferior vena cava is normal in size with greater  than 50%  respiratory variability, suggesting right atrial pressure of 3 mmHg.   Cardiac monitor 11/24/2020 personally reviewed Rate 58 bpm, sinus rhythm. HR 35 BPM at 11:30pm presumably during sleep She had 53 separate episodes of supraventricular tachycardia, longest lasting 18 beats at 95 bpm. Fastest was 200 bpm lasting 5 beats. Occasional symptoms surrounding these episodes. Rare PVCs, PACs.  ASSESSMENT AND PLAN:  1.  SVT/PVCs: PVC burden is less than 1%.  She does have SVT as well, though it is quite slow.  She was put on diltiazem 120 mg daily at the last visit.  She has not started her diltiazem as she has felt well.  She has had minimal symptoms.  We Annika Selke continue with current management.  Current medicines are reviewed at length with the patient today.   The patient does not have concerns regarding her medicines.  The following changes were made today: None  Labs/ tests ordered today include:  Orders Placed This Encounter  Procedures   EKG 12-Lead      Disposition:   FU with Lenita Peregrina 12 months  Signed, [place] Kielbasa Meredith Leeds, MD  05/19/2021 4:11 PM     [place] [address] [phone] (office) [phone]  (fax)

## 2021-07-06 ENCOUNTER — Other Ambulatory Visit: Payer: Self-pay | Admitting: Family Medicine

## 2021-07-06 DIAGNOSIS — Z1231 Encounter for screening mammogram for malignant neoplasm of breast: Secondary | ICD-10-CM

## 2021-09-05 ENCOUNTER — Ambulatory Visit
Admission: RE | Admit: 2021-09-05 | Discharge: 2021-09-05 | Disposition: A | Discharge status: Home / Self Care | Payer: No Typology Code available for payment source | Source: Ambulatory Visit | Attending: Family Medicine | Admitting: Family Medicine

## 2021-09-05 DIAGNOSIS — Z1231 Encounter for screening mammogram for malignant neoplasm of breast: Secondary | ICD-10-CM

## 2021-11-08 ENCOUNTER — Encounter: Payer: Self-pay | Admitting: Physical Therapy

## 2021-11-08 ENCOUNTER — Ambulatory Visit: Payer: No Typology Code available for payment source | Attending: Family Medicine | Admitting: Physical Therapy

## 2021-11-08 DIAGNOSIS — R42 Dizziness and giddiness: Secondary | ICD-10-CM | POA: Diagnosis present

## 2021-11-08 DIAGNOSIS — R2681 Unsteadiness on feet: Secondary | ICD-10-CM | POA: Insufficient documentation

## 2021-11-08 NOTE — Therapy (Addendum)
[facility] [place] ?[facility] ?[address], [address] ?[place], [place], [address] ?Phone: [phone]   Fax:  [phone] ? ?Physical Therapy Evaluation ? ?Patient Details  ?Name: Suzanne Marks ?MRN: [id number] ?Date of Birth: 1965/12/22 ?Referring Provider (PT): Jonathon Jordan, MD ? ? ?Encounter Date: 11/08/2021 ? ? PT End of Session - 11/08/21 1657   ? ? Visit Number 1   ? Number of Visits 1   ? Date for PT Re-Evaluation 11/08/21   ? Authorization Type [employer]   ? PT Start Time 5726   ? PT Stop Time 2035   ? PT Time Calculation (min) 47 min   ? Activity Tolerance Patient tolerated treatment well   ? Behavior During Therapy [facility] for tasks assessed/performed   ? ?  ?  ? ?  ? ? ?Past Medical History:  ?Diagnosis Date  ? Anemia   ? Anxiety   ? Bell's palsy 2000  ? Carpal tunnel syndrome   ? both wrists  ? Carpal tunnel syndrome   ? Chest discomfort   ? Depression   ? Fasciculation   ? GERD (gastroesophageal reflux disease)   ? Low ferritin level   ? no iron taken  ? Palpitations   ? Paresthesias   ? White coat syndrome with diagnosis of hypertension   ? ? ?Past Surgical History:  ?Procedure Laterality Date  ? BLADDER REPAIR  09/20/2016  ? Procedure: BLADDER REPAIR;  Surgeon: Janyth Pupa, DO;  Location: [facility];  Service: Gynecology;;  ? [place] STUDY N/A 01/21/2014  ? Procedure: BRAVO [place];  Surgeon: Arta Silence, MD;  Location: [facility];  Service: Endoscopy;  Laterality: N/A;  ? ESOPHAGOGASTRODUODENOSCOPY (EGD) WITH PROPOFOL N/A 01/21/2014  ? Procedure: ESOPHAGOGASTRODUODENOSCOPY (EGD) WITH PROPOFOL;  Surgeon: Arta Silence, MD;  Location: [facility];  Service: Endoscopy;  Laterality: N/A;  ? EXTERNAL EAR SURGERY    ? age 53  ? EYE SURGERY    ? FOOT SURGERY  2012  ? left  ? GUM SURGERY  2011  ? LAPAROSCOPIC VAGINAL HYSTERECTOMY WITH SALPINGECTOMY Bilateral 09/20/2016  ? Procedure: LAPAROSCOPIC ASSISTED VAGINAL HYSTERECTOMY WITH SALPINGECTOMY;  Surgeon: Janyth Pupa, DO;  Location: [facility];  Service: Gynecology;  Laterality: Bilateral;  ? NOVASURE ABLATION  03/2006  ? REFRACTIVE SURGERY Bilateral   ? lasik  ? [place] EXTRACTION  1990  ? ? ?There were no vitals filed for this visit. ? ? ? Subjective Assessment - 11/08/21 1602   ? ? Subjective Patient reports dizziness after going to the beach on 10/01/21. Reports that she had a massage and didn?t drink much water, then didn?t have much to eat or drink the next day when driving to the beach. Went on a long walk the next day without water. While standing on the beach, she started blacking out and she fell on her daughter. Denies injury or hitting head. Felt like being dehydrated was part of the problem. Went to see MD after this event and blood work was fine, was told do some exercises from the internet and those made her feel bad. Showing handout of Nestor Lewandowsky which she has been performing. Feeling better now, but not quite back to normal. Dizziness is described as "off, floating." Unable to differentiate how long symptoms last. Worse when chocolate and alcohol, doing the peloton when standing up. Notes new onset of allergies and some phonophobia. Denies head trauma, infection/illness, vision changes/double vision, hearing loss, tinnitus, otalgia, migraines.   ? Pertinent History  anemia, anxiety, bell's palsy, depression, GERD, foot surgery 2012   ? Limitations Sitting;Lifting;Standing;Walking;House hold activities   ? Diagnostic tests none recent   ? Patient Stated Goals improve dizziness   ? Currently in Pain? Other (Comment)   referral not pain-related  ? ?  ?  ? ?  ? ? ? ? ? [facility] PT Assessment - 11/08/21 1602   ? ?  ? Assessment  ? Medical Diagnosis Dizziness and giddiness   ? Referring Provider (PT) Jonathon Jordan, MD   ? Onset Date/Surgical Date 10/01/21   ? Next MD Visit not scheduled   ? Prior Therapy no   ?  ? Precautions  ? Precautions None   ?  ? Balance Screen  ? Has the patient fallen in the past 6 months Yes   ? How many times?  2   fell off bed, recent fainting spell  ? Has the patient had a decrease in activity level because of a fear of falling?  No   ? Is the patient reluctant to leave their home because of a fear of falling?  No   ?  ? Home Environment  ? Additional Comments lives in 2 story house with family   ?  ? Prior Function  ? Level of Independence Independent   ? Vocation Full time employment   ? Vocation Requirements computer work   ? Leisure peloton   ?  ? Cognition  ? Overall Cognitive Status Within Functional Limits for tasks assessed   ?  ? Sensation  ? Light Touch Appears Intact   ?  ? High Level Balance  ? High Level Balance Comments M-CTSIB: EO/firm WNL, EC/firm mild sway, EO/foam mild sway, EC/foam moderate sway   ? ?  ?  ? ?  ? ? ? ? ? ? ? ? ? Vestibular Assessment - 11/08/21 1618   ? ?  ? Oculomotor Exam  ? Oculomotor Alignment Normal   ? Ocular ROM WNL   ? Spontaneous Absent   ? Gaze-induced  Absent   ? Smooth Pursuits Intact   ? Saccades Intact   ? Comment convergence: blurring at ~3 in   ?  ? Oculomotor Exam-Fixation Suppressed   ? Left Head Impulse positive   ? Right Head Impulse negative   ?  ? Vestibulo-Ocular Reflex  ? VOR 1 Head Only (x 1 viewing) c/o mild dizziness horizontal, vertical intact   ? VOR Cancellation Normal   mild dizziness  ?  ? Positional Testing  ? Dix-Hallpike Dix-Hallpike Right;Dix-Hallpike Left   ? Horizontal Canal Testing Horizontal Canal Right;Horizontal Canal Left   ?  ? Dix-Hallpike Right  ? Dix-Hallpike Right Duration 0   ? Dix-Hallpike Right Symptoms No nystagmus   c/o mild dizziness upon laying down and sititng up  ?  ? Dix-Hallpike Left  ? Dix-Hallpike Left Duration 0   ? Dix-Hallpike Left Symptoms No nystagmus   ?  ? Horizontal Canal Right  ? Horizontal Canal Right Duration 0   ? Horizontal Canal Right Symptoms Normal   ?  ? Horizontal Canal Left  ? Horizontal Canal Left Duration 0   ? Horizontal Canal Left Symptoms Normal   ? ?  ?  ? ?  ? ? ? ? ? ?Objective measurements completed  on examination: See above findings.  ? ? ? ? ? ? ? ? ? ? ? ? ? ? PT Education - 11/08/21 1656   ? ? Education Details prognosis, HEP, edu on vestibular  hypofunction and typical treatment course   ? Person(s) Educated Patient   ? Methods Explanation;Demonstration;Tactile cues;Verbal cues;Handout   ? Comprehension Returned demonstration;Verbalized understanding   ? ?  ?  ? ?  ? ? ? PT Short Term Goals - 11/08/21 1658   ? ?  ? PT SHORT TERM GOAL #1  ? Title Patient to be independent with initial HEP.   ? Status Achieved   ? Target Date 11/08/21   ? ?  ?  ? ?  ? ? ? ? PT Long Term Goals - 11/08/21 1659   ? ?  ? PT LONG TERM GOAL #1  ? Title Patient to be independent with initial HEP.   ? Status Achieved   ? Target Date 11/08/21   ? ?  ?  ? ?  ? ? ? ? ? ? ? ? ? Plan - 11/08/21 1657   ? ? Clinical Impression Statement Patient is a 56 y/o F presenting to [facility] with c/o dizziness since 10/01/21. Reports improvement in dizziness since first onset, however notes remaining feeling of being "off, floating." Aggravated by consuming chocolate and alcohol and when doing the peloton when standing up. Notes new onset of allergies and some phonophobia. Denies head trauma, infection/illness, vision changes/double vision, hearing loss, tinnitus, otalgia, migraines. 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 Positional testing was negative with exception of mild dizziness upon laying down and sitting up from [facility]. Patient was educated on HEP to address deficits and placed on 30 day hold per her request.  ? Personal Factors and Comorbidities Age;Fitness;Past/Current Experience;Comorbidity 3+;Time since onset of injury/illness/exacerbation   ? Comorbidities anemia, anxiety, bell's palsy, depression, GERD, foot surgery 2012   ? Examination-Activity Limitations Bed Mobility;Reach Overhead;Bend;Squat;Stairs;Stand   ? Examination-Participation  Restrictions Yard Work;Driving;Cleaning   ? Stability/Clinical Decision Making Evolving/Moderate complexity   ? Clinical Decision Making Moderate   ? Rehab Potential Good   ? PT Frequency One time visit   ? PT

## 2021-11-08 NOTE — Patient Instructions (Signed)
Access Code: [email or web address] ?URL: [email or web address] ?Date: 11/08/2021 ?Prepared by: [facility] ? ?Exercises ?- Standing Gaze Stabilization with Head Rotation  - 1 x daily - 5 x weekly - 2 sets - 30 sec hold ?- Standing VOR Cancellation  - 1 x daily - 5 x weekly - 2 sets - 30 sec hold ?- Walking Gaze Stabilization Head Nod  - 1 x daily - 5 x weekly - 2 sets - 10 reps ?- Romberg Stance Eyes Closed on Foam Pad  - 1 x daily - 5 x weekly - 2 sets - 30 sec hold ?*advised to perform in corner and with feet slightly apart for safety ? ? ? ? ? ?

## 2021-11-09 ENCOUNTER — Other Ambulatory Visit: Payer: Self-pay | Admitting: Family Medicine

## 2021-11-09 DIAGNOSIS — K7689 Other specified diseases of liver: Secondary | ICD-10-CM

## 2021-11-30 ENCOUNTER — Ambulatory Visit: Payer: No Typology Code available for payment source | Attending: Family Medicine | Admitting: Physical Therapy

## 2021-11-30 ENCOUNTER — Encounter: Payer: Self-pay | Admitting: Physical Therapy

## 2021-11-30 DIAGNOSIS — R42 Dizziness and giddiness: Secondary | ICD-10-CM | POA: Insufficient documentation

## 2021-11-30 DIAGNOSIS — R2681 Unsteadiness on feet: Secondary | ICD-10-CM | POA: Insufficient documentation

## 2021-11-30 NOTE — Therapy (Addendum)
[facility] [address] [place], [place], [address] Phone: [phone]   Fax:  [phone]  Physical Therapy Treatment  Patient Details  Name: Suzanne Marks MRN: [id number] Date of Birth: 02/21/1966 Referring Provider (PT): Jonathon Jordan, MD   Encounter Date: 11/30/2021   PT End of Session - 11/30/21 1705     Visit Number 2    Number of Visits 5    Date for PT Re-Evaluation 02/01/22    Authorization Type [employer]    PT Start Time 1619    PT Stop Time 1700    PT Time Calculation (min) 41 min    Activity Tolerance Patient tolerated treatment well    Behavior During Therapy [facility] for tasks assessed/performed             Past Medical History:  Diagnosis Date   Anemia    Anxiety    Bell's palsy 2000   Carpal tunnel syndrome    both wrists   Carpal tunnel syndrome    Chest discomfort    Depression    Fasciculation    GERD (gastroesophageal reflux disease)    Low ferritin level    no iron taken   Palpitations    Paresthesias    White coat syndrome with diagnosis of hypertension     Past Surgical History:  Procedure Laterality Date   BLADDER REPAIR  09/20/2016   Procedure: BLADDER REPAIR;  Surgeon: Janyth Pupa, DO;  Location: [facility];  Service: Gynecology;;   [place] STUDY N/A 01/21/2014   Procedure: BRAVO [place] STUDY;  Surgeon: Arta Silence, MD;  [facility]: [facility];  Service: Endoscopy;  Laterality: N/A;   ESOPHAGOGASTRODUODENOSCOPY (EGD) WITH PROPOFOL N/A 01/21/2014   Procedure: ESOPHAGOGASTRODUODENOSCOPY (EGD) WITH PROPOFOL;  Surgeon: Arta Silence, MD;  [facility];  Service: Endoscopy;  Laterality: N/A;   EXTERNAL EAR SURGERY     age 56   EYE SURGERY     FOOT SURGERY  2012   left   GUM SURGERY  2011   LAPAROSCOPIC VAGINAL HYSTERECTOMY WITH SALPINGECTOMY Bilateral 09/20/2016   Procedure: LAPAROSCOPIC ASSISTED VAGINAL HYSTERECTOMY WITH SALPINGECTOMY;  Surgeon: Janyth Pupa, DO;  Location: [facility];  Service: Gynecology;  Laterality: Bilateral;   NOVASURE ABLATION  03/2006   REFRACTIVE SURGERY Bilateral    lasik   WISDOM TOOTH EXTRACTION  1990    There were no vitals filed for this visit.   Subjective Assessment - 11/30/21 1618     Subjective Still having a floating feeling. Did her exercises 3x/day. Has also found some exercises online and that is helping some. Just want to make sure everything is okay. Reports inconsistency on what makes symptoms worse- not necessarily worse with movement or activity. Still having sinus pressure and drainage- but not taking allergy meds    Pertinent History anemia, anxiety, bell's palsy, depression, GERD, foot surgery 2012    Diagnostic tests none recent    Patient Stated Goals improve dizziness    Currently in Pain? No/denies                [facility] PT Assessment - 11/30/21 0001       Assessment   Medical Diagnosis Dizziness and giddiness    Referring Provider (PT) Jonathon Jordan, MD    Onset Date/Surgical Date 10/01/21      High Level Balance   High Level Balance Comments M-CTSIB: EO/firm WNL, EC/firm mild sway, EO/foam WNL, EC/foam moderate sway      Functional  Gait  Assessment   Gait assessed  Yes    Gait Level Surface Walks 20 ft in less than 5.5 sec, no assistive devices, good speed, no evidence for imbalance, normal gait pattern, deviates no more than 6 in outside of the 12 in walkway width.    Change in Gait Speed Able to smoothly change walking speed without loss of balance or gait deviation. Deviate no more than 6 in outside of the 12 in walkway width.    Gait with Horizontal Head Turns Performs head turns smoothly with no change in gait. Deviates no more than 6 in outside 12 in walkway width    Gait with Vertical Head Turns Performs head turns with no change in gait. Deviates no more than 6 in outside 12 in walkway width.    Gait and Pivot Turn Pivot turns safely within 3 sec and stops quickly with no loss of balance.     Step Over Obstacle Is able to step over 2 stacked shoe boxes taped together (9 in total height) without changing gait speed. No evidence of imbalance.    Gait with Narrow Base of Support Is able to ambulate for 10 steps heel to toe with no staggering.    Gait with Eyes Closed Walks 20 ft, no assistive devices, good speed, no evidence of imbalance, normal gait pattern, deviates no more than 6 in outside 12 in walkway width. Ambulates 20 ft in less than 7 sec.    Ambulating Backwards Walks 20 ft, uses assistive device, slower speed, mild gait deviations, deviates 6-10 in outside 12 in walkway width.    Steps Alternating feet, no rail.    Total Score 29                 Vestibular Assessment - 11/30/21 0001       Oculomotor Exam-Fixation Suppressed    Left Head Impulse positive    Right Head Impulse negative                      [facility] Adult PT Treatment/Exercise - 11/30/21 0001       Neuro Re-ed    Neuro Re-ed Details  romberg EC head turns and nods 30", wall bumps shoulderhip with EC on foam 5x, standing VOR cancellation 30", romberg horizontal VOR 30"   catch up saccades to R evident with VOR cancellation- no dizziness                    PT Education - 11/30/21 1704     Education Details update to HEP; answered patient's questions    Person(s) Educated Patient    Methods Explanation;Demonstration;Tactile cues;Verbal cues;Handout    Comprehension Verbalized understanding;Returned demonstration              PT Short Term Goals - 11/30/21 1711       PT SHORT TERM GOAL #1   Title Patient to be independent with initial HEP.    Status Achieved    Target Date 11/08/21               PT Long Term Goals - 11/30/21 1711       PT LONG TERM GOAL #1   Title Patient to be independent with advanced HEP.    Status New    Target Date 02/01/22      PT LONG TERM GOAL #2   Title Patient to demonstrate mild sway with M-CTSIB condition 4 to improve  safety in environments  with dim lighting and uneven surfaces.    Time 9    Period Weeks    Status New    Target Date 02/01/22      PT LONG TERM GOAL #3   Title Patient to report 90% improvement in dizziness/"floating" sensation.    Time 9    Period Weeks    Status New    Target Date 02/01/22                   Plan - 11/30/21 1706     Clinical Impression Statement Patient arrived to session with report of remaining sensation of "floating feeling." Reports inconsistency on what makes symptoms worse- not necessarily worse with movement or activity. Still having sinus pressure and drainage but not taking allergy meds. Patient scored 29/30 on FGA, indicating a decreased risk of falls. However, still demonstrating mild and moderate sway with conditions 2 and 4 on M-CTSIB, indicating poor use of vestibular system with balance. 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 Reviewed HEP and provided more challenging activities focusing on challenging VOR and balance with EC. Updated goals to address patient's remaining impairments. Patient would benefit from additional skilled PT services 1x/3 weeks for 9 weeks as needed for imbalance.    Personal Factors and Comorbidities Age;Fitness;Past/Current Experience;Comorbidity 3+;Time since onset of injury/illness/exacerbation    Comorbidities anemia, anxiety, bell's palsy, depression, GERD, foot surgery 2012    Examination-Activity Limitations Bed Mobility;Reach Overhead;Bend;Squat;Stairs;Stand    Examination-Participation Restrictions Yard Work;Driving;Cleaning    Stability/Clinical Decision Making Evolving/Moderate complexity    Rehab Potential Good    PT Frequency Other (comment)   1x/3 weeks for 9 weeks   PT Duration Other (comment)    PT Treatment/Interventions Canalith Repostioning;Moist Heat;Cryotherapy;Gait training;Stair training;Functional mobility training;Therapeutic activities;Therapeutic exercise;Balance training;Neuromuscular re-education;Manual  techniques;Patient/family education;Passive range of motion;Dry needling;Energy conservation;Vestibular;Taping    PT Next Visit Plan reassess HEP; progress VOR and sensory integration    Consulted and Agree with Plan of Care Patient             Patient will benefit from skilled therapeutic intervention in order to improve the following deficits and impairments:  Dizziness, Decreased balance  Visit Diagnosis: Dizziness and giddiness  Unsteadiness on feet     Problem List Patient Active Problem List   Diagnosis Date Noted   Abnormal uterine bleeding 09/20/2016   Muscle twitching 05/28/2013   Anxiety 02/05/2013   Mild carpal tunnel syndrome 02/05/2013    Janene Harvey, PT, DPT 11/30/21 5:16 PM   [facility] [address] [place], [place], [address] Phone: [phone]   Fax:  [phone]  Name: Suzanne Marks MRN: [id number] Date of Birth: [birth year]   PHYSICAL THERAPY DISCHARGE SUMMARY  Visits from Start of Care: 2  Current functional level related to goals / functional outcomes: Unable to assess; patient did not return   Remaining deficits: Unable to assess   Education / Equipment: HEP  Plan: Patient agrees to discharge.  Patient goals were not met. Patient is being discharged due to not returning to PT.    Janene Harvey, PT, DPT 01/09/22 4:00 PM [facility], [place], [place] [address] Phone # [phone] Fax # [phone]

## 2021-12-20 ENCOUNTER — Ambulatory Visit (INDEPENDENT_AMBULATORY_CARE_PROVIDER_SITE_OTHER): Payer: No Typology Code available for payment source | Admitting: Podiatry

## 2021-12-20 ENCOUNTER — Ambulatory Visit (INDEPENDENT_AMBULATORY_CARE_PROVIDER_SITE_OTHER): Payer: No Typology Code available for payment source

## 2021-12-20 DIAGNOSIS — M21619 Bunion of unspecified foot: Secondary | ICD-10-CM | POA: Diagnosis not present

## 2021-12-20 DIAGNOSIS — M21611 Bunion of right foot: Secondary | ICD-10-CM

## 2021-12-21 ENCOUNTER — Ambulatory Visit: Payer: No Typology Code available for payment source | Admitting: Physical Therapy

## 2021-12-21 ENCOUNTER — Encounter: Payer: Self-pay | Admitting: Podiatry

## 2021-12-21 NOTE — Progress Notes (Signed)
  Subjective:  Patient ID: Suzanne Marks, female    DOB: May 27, 1966,  MRN: [id number]  Chief Complaint  Patient presents with   Bunions       right foot pain/ bunion area swelling    56 y.o. female presents with the above complaint. History confirmed with patient.  She noticed worsening pain on the medial side of the great toe joint of the right foot last week.  This became much worse after wearing her Peloton cycling shoes.  She iced elevated and use Aspercreme and this made it feel better.  Not having much pain now.  No personal or family history of gout her father-in-law does have it.  She previously had bunion correction on the left foot with Dr. Milinda Pointer  Objective:  Physical Exam: warm, good capillary refill, no trophic changes or ulcerative lesions, normal DP and PT pulses, normal sensory exam, and mild hallux valgus deformity with medial bunion on the right foot, previous correction of bunion on left foot with no recurrence.  Good range of motion of joint.  No crepitance no significant edema or erythema  Radiographs: Multiple views x-ray of the right foot: no fracture, dislocation, swelling or degenerative changes noted and she has mild hallux valgus deformity Assessment:   1. Bunion      Plan:  Patient was evaluated and treated and all questions answered.  Suspect she had irritation of the underlying hallux valgus bunion deformity on the right foot.  We discussed surgical and nonsurgical treatment.  Discussed padding with a silicone bunion shield especially when she is in tighter shoes such as cycling shoes.  She is not interested in surgical correction for this foot at this time.  She will return to see me as needed.  Seems to have resolved now.  Clinically did not seem to be consistent with gouty arthritis we discussed checking a uric acid but I do not think this will provide much clinical utility currently.  I will see her back as needed. Return if symptoms worsen or fail to  improve.

## 2022-01-25 ENCOUNTER — Other Ambulatory Visit: Payer: Self-pay | Admitting: Gastroenterology

## 2022-01-25 DIAGNOSIS — K7689 Other specified diseases of liver: Secondary | ICD-10-CM

## 2022-02-13 ENCOUNTER — Other Ambulatory Visit: Payer: No Typology Code available for payment source

## 2022-03-16 ENCOUNTER — Encounter: Payer: Self-pay | Admitting: *Deleted

## 2022-03-20 ENCOUNTER — Telehealth: Payer: Self-pay | Admitting: *Deleted

## 2022-03-20 ENCOUNTER — Encounter: Payer: Self-pay | Admitting: Diagnostic Neuroimaging

## 2022-03-20 ENCOUNTER — Ambulatory Visit (INDEPENDENT_AMBULATORY_CARE_PROVIDER_SITE_OTHER): Payer: No Typology Code available for payment source | Admitting: Diagnostic Neuroimaging

## 2022-03-20 VITALS — BP 158/85 | HR 70 | Ht 65.0 in | Wt 121.2 lb

## 2022-03-20 DIAGNOSIS — R202 Paresthesia of skin: Secondary | ICD-10-CM

## 2022-03-20 NOTE — Telephone Encounter (Signed)
Dr Leta Baptist ordered MRI brian, order to be faxed to [facility]. Paperwork on Tori's desk to process order.

## 2022-03-20 NOTE — Progress Notes (Signed)
[facility]  PATIENT: Suzanne Marks DOB: 1965-11-11  REFERRING CLINICIAN: Marlaine Hind, MD HISTORY FROM: patient and chart review REASON FOR VISIT: new consult    HISTORICAL  CHIEF COMPLAINT:  Chief Complaint  Patient presents with   Numbness    Rm 6 Est pt "numbness and tingling in buttocks, outside knees, have had some at jawline, other symptoms of cramping; have had several MRIs and more scheduled."     HISTORY OF PRESENT ILLNESS:   UPDATE (03/20/22, VRP): Since last visit, doing doing well until January 30, 2022.  Started to have some lower abdominal cramps.  Went to OB/GYN for evaluation.  Then had a tingling and numbness sensation in the groin region.  Symptoms moved to the buttock region.  Symptoms continued to progress over time.  Went to PCP and then referred to pain management.  Had some additional sensations in hands, arms, jaw and face.  Concern for MS was raised.  Patient had MRI of the cervical spine which showed minimal degenerative changes but no significant spinal stenosis or foraminal stenosis.  Had MRI of the pelvis which showed some arthritis changes.  Symptoms have continued.   UPDATE (01/20/20, VRP): Since last visit, new sxs of numbness, pain, twitching, burning sensations since April 2021. Lower jaw, lips, hands, heels, eyes. Symptoms are intermittent. Severity is mild-moderate. No alleviating or aggravating factors.  Recent labs with PCP in Feb 2021 were normal including B12. Patient is active with walking and exercise. Mainly plant based diet with some supplements. No recent changes in stress, sleep or activity. Went to dentist and rule out dental / jaw issues.   NEW HPI (04/13/17, VRP): 56 year old female here for evaluation of numbness, tingling, pain. Patient was previously evaluated in 2014 for similar issue. Since last visit, was doing well until early summer, with increased muscle twitching. In August 2018, she increased her usual 7 mile  walk to include some running sessions (because she was running late in the morning and wanted to finish more quickly) 3 days out of 1 week. Then she felt more pain in the buttock region bilaterally. She treated this with a massage treatment, but then felt intermittent numbness in legs. She discussed this with her family, and was concerned about this progression of new symptoms. Therefore she requested evaluation in our neurology clinic. Since that time her symptoms have slightly improved, mainly since yesterday.    UPDATE 05/28/13 (LL): Suzanne Marks returns for follow up because her symptoms have not improved.  She is still having muscle twitching in different areas of her body (calf, face, abdomen, gluteus) and paresthesias in her hands and feet.  She recently had anemia panel done which was normal.  Her HAM-A score is 20 (moderate anxiety).  She admits to being a worrier, but would rather exercise and relax natural ways than take medicines.  She has an Rx for Xanax but has not taken many.  PRIOR HPI (02/05/13): Suzanne Marks is a 56 year old right-handed Caucasian female who comes in for consultation for bilateral arm paresthesias left greater than right and muscle twitching. Patient reports that earlier in the year she was driving home from [facility] and noticed numbness and tingling in the anterior aspects of her bilateral forearms and some numbness in the left hand while gripping the steering wheel. Symptoms were intermittent and occurred during brief intervals over the next few weeks.  She was referred for nerve conduction study and EMG which was completed on 09/24/2012 and showed  bilateral ulnar sensory and motor responses were normal. Bilateral median sensory response showed mildly prolonged peak latency. Bilateral median motor responses showed mildly prolonged distal latency consistent with mild carpal tunnel syndrome.  Afterwards patient tried wearing wrist splints during the night which were  uncomfortable and she started using a keyboard pad and mouse pad. Symptoms disappeared within one week after adding these items and have not recurred. Patient has noticed that she has increased muscle twitching occurring in bilateral legs and around right eye which started occurring after she found out her mother's brain tumor had returned and she had less than 2 months to live. Patient realizes that she has greatly increased anxiety since finding out about her mother's condition.  Twitching is intermittent and last only a few seconds mostly in her legs.  She knows of nothing that triggers the symptoms or necessarily relieves his symptoms.  Her mother has passed about 2 months ago.  She has also been experiencing palpitations which she has seen her primary care provider for and thought to be having panic attacks and was prescribed prn Xanax.  She doesn't like to take this medication and less absolutely needed. She states she went for massage and was told that her muscles were extremely tight all over.  She does exercise 1-1-1/2 hours per day to try to help relieve stress and does not consume caffeine on a daily basis.  Of note when she went on a week's vacation to the beach, she did not have any of the mentioned symptoms.      REVIEW OF SYSTEMS: Full 14 system review of systems performed and negative with exception of: as per HPI.  ALLERGIES: Allergies  Allergen Reactions   Lanolin     Topical causes rash    HOME MEDICATIONS: Outpatient Medications Prior to Visit  Medication Sig Dispense Refill   ALOE VERA EX Apply topically in the morning and at bedtime.     Ascorbic Acid (VITAMIN C PO) Take by mouth.     b complex vitamins tablet Take 1 tablet by mouth. Takes 1 MWF     co-enzyme Q-10 30 MG capsule Take 30 mg by mouth daily.     Cyanocobalamin (B-12 PO) Take 1 mg by mouth daily.     Digestive Enzyme CAPS Take 1 capsule by mouth 3 (three) times daily with meals as needed.      ergocalciferol  (VITAMIN D2) 1.25 MG (50000 UT) capsule Take by mouth once a week. 5,000 units     Iron-FA-B Cmp-C-Biot-Probiotic (FUSION PLUS PO) Take 1 capsule by mouth 2 (two) times a week.     MAGNESIUM PO Take 300 mg by mouth daily.     Menaquinone-7 (K2 PO) Take by mouth.     Nutritional Supplements (DHEA PO) Take 5 mg by mouth.     OVER THE COUNTER MEDICATION Turmeric Curcumin w/ blk pepper with breakfast.     OVER THE COUNTER MEDICATION One Metagenics Ultra flora Blance Probiotic when wakes up     [place] Name: algae based omega 3, 2 pills daily     No facility-administered medications prior to visit.    PAST MEDICAL HISTORY: Past Medical History:  Diagnosis Date   Anemia    Anxiety    Bell's palsy 2000   Carpal tunnel syndrome    both wrists   Carpal tunnel syndrome    Chest discomfort    Depression    Fasciculation    GERD (gastroesophageal reflux disease)  Low ferritin level    no iron taken   Palpitations    Paresthesias    Vertigo    White coat syndrome with diagnosis of hypertension     PAST SURGICAL HISTORY: Past Surgical History:  Procedure Laterality Date   BLADDER REPAIR  09/20/2016   Procedure: BLADDER REPAIR;  Surgeon: Janyth Pupa, DO;  Location: [facility];  Service: Gynecology;;   [facility] STUDY N/A 01/21/2014   Procedure: BRAVO [place];  Surgeon: Arta Silence, MD;  Location: [facility];  Service: Endoscopy;  Laterality: N/A;   ESOPHAGOGASTRODUODENOSCOPY (EGD) WITH PROPOFOL N/A 01/21/2014   Procedure: ESOPHAGOGASTRODUODENOSCOPY (EGD) WITH PROPOFOL;  Surgeon: Arta Silence, MD;  Location: [facility];  Service: Endoscopy;  Laterality: N/A;   EXTERNAL EAR SURGERY     age 73   EYE SURGERY     FOOT SURGERY  2012   left   GUM SURGERY  2011   LAPAROSCOPIC VAGINAL HYSTERECTOMY WITH SALPINGECTOMY Bilateral 09/20/2016   Procedure: LAPAROSCOPIC ASSISTED VAGINAL HYSTERECTOMY WITH SALPINGECTOMY;  Surgeon: Janyth Pupa, DO;  Location: [facility];  Service:  Gynecology;  Laterality: Bilateral;   NOVASURE ABLATION  03/2006   REFRACTIVE SURGERY Bilateral 05/2011   lasik   WISDOM TOOTH EXTRACTION  1990    FAMILY HISTORY: Family History  Problem Relation Age of Onset   Hypertension Father    Cancer Mother        Glioblastoma   Seizures Mother    Heart Problems Maternal Grandmother    Parkinsonism Maternal Grandmother    Depression Maternal Grandmother    Heart Problems Maternal Grandfather    Heart Problems Paternal Grandmother    Heart Problems Paternal Grandfather     SOCIAL HISTORY:  Social History   Socioeconomic History   Marital status: Married    Spouse name: Carloyn Manner   Number of children: 2   Years of education: Secretary/administrator   Highest education level: Not on file  Occupational History    Employer: [employer]  Tobacco Use   Smoking status: Never   Smokeless tobacco: Never  Substance and Sexual Activity   Alcohol use: Yes    Comment: Rare   Drug use: No   Sexual activity: Yes    Birth control/protection: Other-see comments, Surgical    Comment: vasectomy  Other Topics Concern   Not on file  Social History Narrative   Patient lives at home with her family.  Works at [employer].  2 Children.     Caffeine Use: occasionally (iced tea)   Social Determinants of Health   Financial Resource Strain: Not on file  Food Insecurity: Not on file  Transportation Needs: Not on file  Physical Activity: Not on file  Stress: Not on file  Social Connections: Not on file  Intimate Partner Violence: Not on file     PHYSICAL EXAM  GENERAL EXAM/CONSTITUTIONAL: Vitals:  Vitals:   03/20/22 1401  BP: (!) 158/85  Pulse: 70  Weight: 121 lb 3.2 oz (55 kg)  Height: '5\' 5"'$  (1.651 m)   Body mass index is 20.17 kg/m. No results found. Patient is in no distress; well developed, nourished and groomed; neck is supple  CARDIOVASCULAR: Examination of carotid arteries is normal; no carotid bruits Regular rate and  rhythm, no murmurs Examination of peripheral vascular system by observation and palpation is normal  EYES: Ophthalmoscopic exam of optic discs and posterior segments is normal; no papilledema or hemorrhages  MUSCULOSKELETAL: Gait, strength, tone, movements noted in Neurologic exam below  NEUROLOGIC: MENTAL STATUS:      No data to display         awake, alert, oriented to person, place and time recent and remote memory intact normal attention and concentration language fluent, comprehension intact, naming intact,  fund of knowledge appropriate  CRANIAL NERVE:  2nd - no papilledema on fundoscopic exam 2nd, 3rd, 4th, 6th - pupils equal and reactive to light, visual fields full to confrontation, extraocular muscles intact, no nystagmus 5th - facial sensation symmetric 7th - facial strength symmetric 8th - hearing intact 9th - palate elevates symmetrically, uvula midline 11th - shoulder shrug symmetric 12th - tongue protrusion midline  MOTOR:  normal bulk and tone, full strength in the BUE, BLE NO FASCICULATIONS  SENSORY:  normal and symmetric to light touch, temperature, vibration  COORDINATION:  finger-nose-finger, fine finger movements normal  REFLEXES:  deep tendon reflexes present and symmetric  GAIT/STATION:  narrow based gait    DIAGNOSTIC DATA (LABS, IMAGING, TESTING) - I reviewed patient records, labs, notes, testing and imaging myself where available.  Lab Results  Component Value Date   WBC 13.6 (H) 09/21/2016   HGB 11.8 (L) 09/21/2016   HCT 32.9 (L) 09/21/2016   MCV 89.9 09/21/2016   PLT 274 09/21/2016      Component Value Date/Time   NA 133 (L) 09/21/2016 0539   K 3.7 09/21/2016 0539   CL 100 (L) 09/21/2016 0539   CO2 27 09/21/2016 0539   GLUCOSE 89 09/21/2016 0539   BUN 6 09/21/2016 0539   CREATININE 0.66 09/21/2016 0539   CREATININE 0.66 02/27/2013 1501   CALCIUM 8.5 (L) 09/21/2016 0539   PROT 7.1 01/20/2020 1202   ALBUMIN 4.1  09/07/2016 1510   AST 38 09/07/2016 1510   ALT 34 09/07/2016 1510   ALKPHOS 58 09/07/2016 1510   BILITOT 0.5 09/07/2016 1510   GFRNONAA >60 09/21/2016 0539   GFRAA >60 09/21/2016 0539   Lab Results  Component Value Date   CHOL 188 02/27/2013   HDL 75 02/27/2013   LDLCALC 100 (H) 02/27/2013   TRIG 66 02/27/2013   CHOLHDL 2.5 02/27/2013   Lab Results  Component Value Date   HGBA1C 5.1 07/17/2012   No results found for: "VITAMINB12" Lab Results  Component Value Date   TSH 1.897 02/27/2013    06/13/13 MRI brain  - normal  06/04/13 EMG/NCS Mild abnormal study demonstrating: 1. 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 2. No evidence of diffuse large fiber neuropathy or myopathy at this time 3. Compared to prior study from 09/24/12, right median motor response distal latency has improved otherwise no significant change.    02/01/20 MRI brain - Normal MRI brain (with and without).    ASSESSMENT AND PLAN  56 y.o. year old female here with intermittent numbness and twitching since 2014. May represent benign fasciculations and benign paresthesias. Previous workup in 2014 and 2021 was unremarkable.  Dx:  1. Paresthesias     PLAN:  NUMBNESS / TINGLING SENSATION (suspect benign paresthesias; now with worsening symptoms) - recheck MRI brain (rule out demyelinating dz) - continue to optimize nutrition, exercise, sleep  Orders Placed This Encounter  Procedures   MR [place]   Return for pending test results, pending if symptoms worsen or fail to improve.    Suzanne Bombard, MD 5/45/6256, 3:89 PM Certified in Neurology, Neurophysiology and Neuroimaging  [facility] [address], [place], [place]  [address] [phone] 

## 2022-03-23 NOTE — Telephone Encounter (Signed)
[employer] office left me a voice mail yesterday that pre authorization is not required. I have given the pod the order to get signed then I will fax it.

## 2022-03-23 NOTE — Telephone Encounter (Signed)
MRI order signed, given to [place] to process.

## 2022-03-23 NOTE — Telephone Encounter (Signed)
I have faxed the order twice to [phone]

## 2022-04-26 ENCOUNTER — Telehealth: Payer: Self-pay

## 2022-04-26 NOTE — Telephone Encounter (Signed)
Called pt and relayed MR Brain results. She thanked and appreciated the call.

## 2022-06-20 ENCOUNTER — Other Ambulatory Visit: Payer: Self-pay | Admitting: Family Medicine

## 2022-06-20 DIAGNOSIS — Z1231 Encounter for screening mammogram for malignant neoplasm of breast: Secondary | ICD-10-CM

## 2022-09-11 ENCOUNTER — Ambulatory Visit
Admission: RE | Admit: 2022-09-11 | Discharge: 2022-09-11 | Disposition: A | Discharge status: Home / Self Care | Payer: No Typology Code available for payment source | Source: Ambulatory Visit | Attending: Family Medicine | Admitting: Family Medicine

## 2022-09-11 DIAGNOSIS — Z1231 Encounter for screening mammogram for malignant neoplasm of breast: Secondary | ICD-10-CM

## 2022-09-14 ENCOUNTER — Encounter: Payer: Self-pay | Admitting: Cardiology

## 2022-09-14 ENCOUNTER — Ambulatory Visit: Payer: No Typology Code available for payment source | Attending: Cardiology | Admitting: Cardiology

## 2022-09-14 VITALS — BP 144/88 | HR 69 | Ht 65.0 in | Wt 123.0 lb

## 2022-09-14 DIAGNOSIS — I471 Supraventricular tachycardia, unspecified: Secondary | ICD-10-CM | POA: Diagnosis not present

## 2022-09-14 DIAGNOSIS — I493 Ventricular premature depolarization: Secondary | ICD-10-CM | POA: Diagnosis not present

## 2022-09-14 NOTE — Patient Instructions (Signed)
Medication Instructions:  °Your physician recommends that you continue on your current medications as directed. Please refer to the Current Medication list given to you today. ° °*If you need a refill on your cardiac medications before your next appointment, please call your pharmacy* ° ° °Lab Work: °None ordered ° ° °Testing/Procedures: °None ordered ° ° °Follow-Up: °At [facility], you and your health needs are our priority.  As part of our continuing mission to provide you with exceptional heart care, we have created designated Provider Care Teams.  These Care Teams include your primary Cardiologist (physician) and Advanced Practice Providers (APPs -  Physician Assistants and Nurse Practitioners) who all work together to provide you with the care you need, when you need it. ° °Your next appointment:   °1 year(s) ° °The format for your next appointment:   °In Person ° °Provider:   °Will Camnitz, MD ° ° ° °Thank you for choosing [facility]!! ° ° °Sherrill Mckamie, RN °[phone] ° ° ° °

## 2022-09-14 NOTE — Progress Notes (Signed)
Electrophysiology Office Note   Date:  09/14/2022   ID:  Aryelle, Tischler 1966-03-10, MRN [id number]  PCP:  Jonathon Jordan, MD  Cardiologist:  Marlou Porch Primary Electrophysiologist:  Tyreonna Czaplicki Meredith Leeds, MD    Chief Complaint: palpitations   History of Present Illness: DESEREY LEN is a 57 y.o. female who is being seen today for the evaluation of SVT at the request of Jonathon Jordan, MD. Presenting today for electrophysiology evaluation.  She has a history significant for SVT and PVCs.  She had been having the symptoms for multiple years.  She does use her Peloton and at times has heart racing.  Reportedly over the last year, her palpitations have been well-controlled.  She is not on any medications for this.  She is quite happy with her control.  Today, denies symptoms of palpitations, chest pain, shortness of breath, orthopnea, PND, lower extremity edema, claudication, dizziness, presyncope, syncope, bleeding, or neurologic sequela. The patient is tolerating medications without difficulties.     Past Medical History:  Diagnosis Date   Anemia    Anxiety    Bell's palsy 2000   Carpal tunnel syndrome    both wrists   Carpal tunnel syndrome    Chest discomfort    Depression    Fasciculation    GERD (gastroesophageal reflux disease)    Low ferritin level    no iron taken   Palpitations    Paresthesias    Vertigo    White coat syndrome with diagnosis of hypertension    Past Surgical History:  Procedure Laterality Date   BLADDER REPAIR  09/20/2016   Procedure: BLADDER REPAIR;  Surgeon: Janyth Pupa, DO;  Location: [facility];  Service: Gynecology;;   [facility] STUDY N/A 01/21/2014   Procedure: BRAVO [place];  Surgeon: Arta Silence, MD;  [facility];  Service: Endoscopy;  Laterality: N/A;   ESOPHAGOGASTRODUODENOSCOPY (EGD) WITH PROPOFOL N/A 01/21/2014   Procedure: ESOPHAGOGASTRODUODENOSCOPY (EGD) WITH PROPOFOL;  Surgeon: Arta Silence, MD;  [facility];  Service: Endoscopy;  Laterality: N/A;   EXTERNAL EAR SURGERY     age 24   EYE SURGERY     FOOT SURGERY  2012   left   GUM SURGERY  2011   LAPAROSCOPIC VAGINAL HYSTERECTOMY WITH SALPINGECTOMY Bilateral 09/20/2016   Procedure: LAPAROSCOPIC ASSISTED VAGINAL HYSTERECTOMY WITH SALPINGECTOMY;  Surgeon: Janyth Pupa, DO;  Location: [facility];  Service: Gynecology;  Laterality: Bilateral;   NOVASURE ABLATION  03/2006   REFRACTIVE SURGERY Bilateral 05/2011   lasik   WISDOM TOOTH EXTRACTION  1990     Current Outpatient Medications  Medication Sig Dispense Refill   ALOE VERA EX Apply topically in the morning and at bedtime.     ALPRAZolam (XANAX) 0.25 MG tablet Take 0.25 mg by mouth 2 (two) times daily as needed for anxiety.     Ascorbic Acid (VITAMIN C PO) Take by mouth.     b complex vitamins tablet Take 1 tablet by mouth. Takes 1 M,T,Th,F     co-enzyme Q-10 30 MG capsule Take 30 mg by mouth daily.     Cyanocobalamin (B-12 PO) Take 1 mg by mouth daily.     Digestive Enzyme CAPS Take 1 capsule by mouth 3 (three) times daily with meals as needed.      ergocalciferol (VITAMIN D2) 1.25 MG (50000 UT) capsule Take by mouth once a week. 5,000 units     Iron-FA-B Cmp-C-Biot-Probiotic (FUSION PLUS PO) Take 1 capsule by mouth 2 (two)  times a week.     MAGNESIUM PO Take 300 mg by mouth daily.     Menaquinone-7 (K2 PO) Take by mouth.     Menaquinone-7 (VITAMIN K2 PO) Take by mouth.     Multiple Vitamin (MULTIVITAMIN) capsule Take 1 capsule by mouth daily. Probiotic     Nutritional Supplements (DHEA PO) Take 5 mg by mouth.     OVER THE COUNTER MEDICATION Turmeric Curcumin w/ blk pepper with breakfast.     OVER THE COUNTER MEDICATION One Metagenics Ultra flora Blance Probiotic when wakes up     [place] Name: algae based omega 3, 2 pills daily     No current facility-administered medications for this visit.    Allergies:   Lanolin   Social History:  The patient  reports that she  has never smoked. She has never used smokeless tobacco. She reports current alcohol use. She reports that she does not use drugs.   Family History:  The patient's family history includes Cancer in her mother; Depression in her maternal grandmother; Heart Problems in her maternal grandfather, maternal grandmother, paternal grandfather, and paternal grandmother; Hypertension in her father; Parkinsonism in her maternal grandmother; Seizures in her mother.   ROS:  Please see the history of present illness.   Otherwise, review of systems is positive for none.   All other systems are reviewed and negative.   PHYSICAL EXAM: VS:  BP (!) 144/88   Pulse 69   Ht 5' 5"$  (1.651 m)   Wt 123 lb (55.8 kg)   LMP 06/28/2016 Comment: continous bleeding  SpO2 98%   BMI 20.47 kg/m  , BMI Body mass index is 20.47 kg/m. GEN: Well nourished, well developed, in no acute distress  HEENT: normal  Neck: no JVD, carotid bruits, or masses Cardiac: RRR; no murmurs, rubs, or gallops,no edema  Respiratory:  clear to auscultation bilaterally, normal work of breathing GI: soft, nontender, nondistended, + BS MS: no deformity or atrophy  Skin: warm and dry Neuro:  Strength and sensation are intact Psych: euthymic mood, full affect  EKG:  EKG is ordered today. Personal review of the ekg ordered shows sinus rhythm   Recent Labs: No results found for requested labs within last 365 days.    Lipid Panel     Component Value Date/Time   CHOL 188 02/27/2013 1501   TRIG 66 02/27/2013 1501   HDL 75 02/27/2013 1501   CHOLHDL 2.5 02/27/2013 1501   VLDL 13 02/27/2013 1501   LDLCALC 100 (H) 02/27/2013 1501     Wt Readings from Last 3 Encounters:  09/14/22 123 lb (55.8 kg)  03/20/22 121 lb 3.2 oz (55 kg)  05/19/21 117 lb (53.1 kg)      Other studies Reviewed: Additional studies/ records that were reviewed today include: TTE 11/15/20  Review of the above records today demonstrates:   1. Left ventricular ejection  fraction, by estimation, is 60 to 65%. The  left ventricle has normal function. The left ventricle has no regional  wall motion abnormalities. Left ventricular diastolic parameters were  normal.   2. Right ventricular systolic function is normal. The right ventricular  size is normal. There is normal pulmonary artery systolic pressure. The  estimated right ventricular systolic pressure is 123XX123 mmHg.   3. The mitral valve is normal in structure. Trivial mitral valve  regurgitation.   4. The aortic valve is tricuspid. Aortic valve regurgitation is mild. No  aortic stenosis is present.   5.  The inferior vena cava is normal in size with greater than 50%  respiratory variability, suggesting right atrial pressure of 3 mmHg.   Cardiac monitor 11/24/2020 personally reviewed Rate 58 bpm, sinus rhythm. HR 35 BPM at 11:30pm presumably during sleep She had 53 separate episodes of supraventricular tachycardia, longest lasting 18 beats at 95 bpm. Fastest was 200 bpm lasting 5 beats. Occasional symptoms surrounding these episodes. Rare PVCs, PACs.  ASSESSMENT AND PLAN:  1.  SVT: Had previously discussed starting diltiazem, but she elected to hold off.  She is having minimal palpitations.  She is able to do all her daily activities.  Happy with her control.  2.  PVCs: Burden of less than 1%.  Minimally symptomatic.  No changes.  3.  Elevated blood pressure: No diagnosis of hypertension.  Blood pressure is usually well-controlled.  No changes.  Current medicines are reviewed at length with the patient today.   The patient does not have concerns regarding her medicines.  The following changes were made today: None  Labs/ tests ordered today include:  Orders Placed This Encounter  Procedures   EKG 12-Lead      Disposition:   FU 12 months  Signed, Jerrelle Michelsen Meredith Leeds, MD  09/14/2022 8:58 AM     [facility] [address] [place] [address] [phone]  (office) [phone] (fax)

## 2022-11-06 ENCOUNTER — Encounter: Payer: Self-pay | Admitting: Adult Health

## 2022-11-06 ENCOUNTER — Ambulatory Visit: Payer: No Typology Code available for payment source | Admitting: Adult Health

## 2022-11-06 VITALS — BP 152/85 | HR 60 | Ht 65.5 in | Wt 119.6 lb

## 2022-11-06 DIAGNOSIS — R202 Paresthesia of skin: Secondary | ICD-10-CM | POA: Diagnosis not present

## 2022-11-06 NOTE — Progress Notes (Signed)
PATIENT: Suzanne Marks DOB: 16-Nov-1965  REASON FOR VISIT: follow up HISTORY FROM: patient PRIMARY NEUROLOGIST:   HISTORY OF PRESENT ILLNESS: Today 11/06/22  Suzanne Marks is a 57 y.o. female who has been followed in this office for paresthesias. Returns today for follow-up.  Reports that her symptoms started on Sunday, February 18.  She has had the symptoms daily since then.  She describes it as a "burning sensation mostly in her calves"  She also get the sensation in the back of the thighs and the back of the arms.  She states on occasion she has had it around the elbows and on the top of the hands.  She states that last month she started getting the sensation in her ankles mainly on the left side.  She describes her muscles feeling tight and weighed down.  She states that sometimes feels like she is going to get a cramp.  Reports that she drove to [facility] last month and her legs and top of the feet were numb.  Occasionally she will have mild muscle twitching but the numbness is more noticeable.  She does workout daily on her Peloton.  She has introduced more stretching and intermittent walking.  Patient does feel that she gets anxious about her symptoms and her overall health.  However in her daily life she does not feel that she is more stressed or any increased anxiety recently.    HISTORY UPDATE (03/20/22, VRP): Since last visit, doing doing well until January 30, 2022.  Started to have some lower abdominal cramps.  Went to OB/GYN for evaluation.  Then had a tingling and numbness sensation in the groin region.  Symptoms moved to the buttock region.  Symptoms continued to progress over time.  Went to PCP and then referred to pain management.  Had some additional sensations in hands, arms, jaw and face.  Concern for MS was raised.  Patient had MRI of the cervical spine which showed minimal degenerative changes but no significant spinal stenosis or foraminal stenosis.  Had MRI of the pelvis  which showed some arthritis changes.  Symptoms have continued.    UPDATE (01/20/20, VRP): Since last visit, new sxs of numbness, pain, twitching, burning sensations since April 2021. Lower jaw, lips, hands, heels, eyes. Symptoms are intermittent. Severity is mild-moderate. No alleviating or aggravating factors.  Recent labs with PCP in Feb 2021 were normal including B12. Patient is active with walking and exercise. Mainly plant based diet with some supplements. No recent changes in stress, sleep or activity. Went to dentist and rule out dental / jaw issues.    NEW HPI (04/13/17, VRP): 57 year old female here for evaluation of numbness, tingling, pain. Patient was previously evaluated in 2014 for similar issue. Since last visit, was doing well until early summer, with increased muscle twitching. In August 2018, she increased her usual 7 mile walk to include some running sessions (because she was running late in the morning and wanted to finish more quickly) 3 days out of 1 week. Then she felt more pain in the buttock region bilaterally. She treated this with a massage treatment, but then felt intermittent numbness in legs. She discussed this with her family, and was concerned about this progression of new symptoms. Therefore she requested evaluation in our neurology clinic. Since that time her symptoms have slightly improved, mainly since yesterday.     UPDATE 05/28/13 (LL): Suzanne Marks returns for follow up because her symptoms have not improved.  She  is still having muscle twitching in different areas of her body (calf, face, abdomen, gluteus) and paresthesias in her hands and feet.  She recently had anemia panel done which was normal.  Her HAM-A score is 20 (moderate anxiety).  She admits to being a worrier, but would rather exercise and relax natural ways than take medicines.  She has an Rx for Xanax but has not taken many.   PRIOR HPI (02/05/13): Suzanne Marks is a 57 year old right-handed Caucasian female  who comes in for consultation for bilateral arm paresthesias left greater than right and muscle twitching. Patient reports that earlier in the year she was driving home from [facility] and noticed numbness and tingling in the anterior aspects of her bilateral forearms and some numbness in the left hand while gripping the steering wheel. Symptoms were intermittent and occurred during brief intervals over the next few weeks.  She was referred for nerve conduction study and EMG which was completed on 09/24/2012 and showed bilateral ulnar sensory and motor responses were normal. Bilateral median sensory response showed mildly prolonged peak latency. Bilateral median motor responses showed mildly prolonged distal latency consistent with mild carpal tunnel syndrome.  Afterwards patient tried wearing wrist splints during the night which were uncomfortable and she started using a keyboard pad and mouse pad. Symptoms disappeared within one week after adding these items and have not recurred. Patient has noticed that she has increased muscle twitching occurring in bilateral legs and around right eye which started occurring after she found out her mother's brain tumor had returned and she had less than 2 months to live. Patient realizes that she has greatly increased anxiety since finding out about her mother's condition.  Twitching is intermittent and last only a few seconds mostly in her legs.  She knows of nothing that triggers the symptoms or necessarily relieves his symptoms.  Her mother has passed about 2 months ago.  She has also been experiencing palpitations which she has seen her primary care provider for and thought to be having panic attacks and was prescribed prn Xanax.  She doesn't like to take this medication and less absolutely needed. She states she went for massage and was told that her muscles were extremely tight all over.  She does exercise 1-1-1/2 hours per day to try to help relieve stress and does not  consume caffeine on a daily basis.  Of note when she went on a week's vacation to the beach, she did not have any of the mentioned symptoms.    REVIEW OF SYSTEMS: Out of a complete 14 system review of symptoms, the patient complains only of the following symptoms, and all other reviewed systems are negative.  ALLERGIES: Allergies  Allergen Reactions   Lanolin     Topical causes rash    HOME MEDICATIONS: Outpatient Medications Prior to Visit  Medication Sig Dispense Refill   ALOE VERA EX Apply topically in the morning and at bedtime.     ALPRAZolam (XANAX) 0.25 MG tablet Take 0.25 mg by mouth 2 (two) times daily as needed for anxiety.     Ascorbic Acid (VITAMIN C PO) Take by mouth.     b complex vitamins tablet Take 1 tablet by mouth. Takes 1 M,T,Th,F     co-enzyme Q-10 30 MG capsule Take 30 mg by mouth daily.     Cyanocobalamin (B-12 PO) Take 1 mg by mouth daily.     Digestive Enzyme CAPS Take 1 capsule by mouth 3 (three) times daily  with meals as needed.      ergocalciferol (VITAMIN D2) 1.25 MG (50000 UT) capsule Take by mouth once a week. 5,000 units     Iron-FA-B Cmp-C-Biot-Probiotic (FUSION PLUS PO) Take 1 capsule by mouth 2 (two) times a week.     MAGNESIUM PO Take 300 mg by mouth daily.     Menaquinone-7 (K2 PO) Take by mouth.     Menaquinone-7 (VITAMIN K2 PO) Take by mouth.     Multiple Vitamin (MULTIVITAMIN) capsule Take 1 capsule by mouth daily. Probiotic     Nutritional Supplements (DHEA PO) Take 5 mg by mouth.     OVER THE COUNTER MEDICATION Turmeric Curcumin w/ blk pepper with breakfast.     OVER THE COUNTER MEDICATION One Metagenics Ultra flora Blance Probiotic when wakes up     UNABLE TO FIND Med Name: algae based omega 3, 2 pills daily     No facility-administered medications prior to visit.    PAST MEDICAL HISTORY: Past Medical History:  Diagnosis Date   Anemia    Anxiety    Bell's palsy 2000   Carpal tunnel syndrome    both wrists   Carpal tunnel syndrome     Chest discomfort    Depression    Fasciculation    GERD (gastroesophageal reflux disease)    Low ferritin level    no iron taken   Palpitations    Paresthesias    Vertigo    White coat syndrome with diagnosis of hypertension     PAST SURGICAL HISTORY: Past Surgical History:  Procedure Laterality Date   BLADDER REPAIR  09/20/2016   Procedure: BLADDER REPAIR;  Surgeon: Myna Hidalgo, DO;  Location: [facility];  Service: Gynecology;;   [facility] STUDY N/A 01/21/2014   Procedure: BRAVO PH STUDY;  Surgeon: Willis Modena, MD;  Location: [facility];  Service: Endoscopy;  Laterality: N/A;   ESOPHAGOGASTRODUODENOSCOPY (EGD) WITH PROPOFOL N/A 01/21/2014   Procedure: ESOPHAGOGASTRODUODENOSCOPY (EGD) WITH PROPOFOL;  Surgeon: Willis Modena, MD;  Location: [facility];  Service: Endoscopy;  Laterality: N/A;   EXTERNAL EAR SURGERY     age 6   EYE SURGERY     FOOT SURGERY  2012   left   GUM SURGERY  2011   LAPAROSCOPIC VAGINAL HYSTERECTOMY WITH SALPINGECTOMY Bilateral 09/20/2016   Procedure: LAPAROSCOPIC ASSISTED VAGINAL HYSTERECTOMY WITH SALPINGECTOMY;  Surgeon: Myna Hidalgo, DO;  Location: [facility];  Service: Gynecology;  Laterality: Bilateral;   NOVASURE ABLATION  03/2006   REFRACTIVE SURGERY Bilateral 05/2011   lasik   WISDOM TOOTH EXTRACTION  1990    FAMILY HISTORY: Family History  Problem Relation Age of Onset   Hypertension Father    Cancer Mother        Glioblastoma   Seizures Mother    Heart Problems Maternal Grandmother    Parkinsonism Maternal Grandmother    Depression Maternal Grandmother    Heart Problems Maternal Grandfather    Heart Problems Paternal Grandmother    Heart Problems Paternal Grandfather     SOCIAL HISTORY: Social History   Socioeconomic History   Marital status: Married    Spouse name: Channing Mutters   Number of children: 2   Years of education: [employer] education level: Not on file  Occupational History    Employer: [employer]  Tobacco Use   Smoking status: Never   Smokeless tobacco: Never  Substance and Sexual Activity   Alcohol use: Yes    Comment: Rare   Drug use:  No   Sexual activity: Yes    Birth control/protection: Other-see comments, Surgical    Comment: vasectomy  Other Topics Concern   Not on file  Social History Narrative   Patient lives at home with her family.  Works at [employer].  2 Children.     Caffeine Use: occasionally (iced tea)   Social Determinants of Health   Financial Resource Strain: Not on file  Food Insecurity: Not on file  Transportation Needs: Not on file  Physical Activity: Not on file  Stress: Not on file  Social Connections: Not on file  Intimate Partner Violence: Not on file      PHYSICAL EXAM  Vitals:   11/06/22 1134  BP: (!) 152/85  Pulse: 60  Weight: 119 lb 9.6 oz (54.3 kg)  Height: 5' 5.5" (1.664 m)   Body mass index is 19.6 kg/m.  Generalized: Well developed, in no acute distress   Neurological examination  Mentation: Alert oriented to time, place, history taking. Follows all commands speech and language fluent Cranial nerve II-XII: Pupils were equal round reactive to light. Extraocular movements were full, visual field were full on confrontational test. Facial sensation and strength were normal. Uvula tongue midline. Head turning and shoulder shrug  were normal and symmetric. Motor: The motor testing reveals 5 over 5 strength of all 4 extremities. Good symmetric motor tone is noted throughout.  Sensory: Sensory testing is intact to soft touch, pinprick and vibration on all 4 extremities. No evidence of extinction is noted.  Coordination: Cerebellar testing reveals good finger-nose-finger and heel-to-shin bilaterally.  Gait and station: Gait is normal.  Able to do heel and toe ambulation without difficulty Romberg is negative. No drift is seen.  Reflexes: Deep tendon reflexes are symmetric and normal bilaterally.   DIAGNOSTIC DATA (LABS,  IMAGING, TESTING) - I reviewed patient records, labs, notes, testing and imaging myself where available.  Lab Results  Component Value Date   WBC 13.6 (H) 09/21/2016   HGB 11.8 (L) 09/21/2016   HCT 32.9 (L) 09/21/2016   MCV 89.9 09/21/2016   PLT 274 09/21/2016      Component Value Date/Time   NA 133 (L) 09/21/2016 0539   K 3.7 09/21/2016 0539   CL 100 (L) 09/21/2016 0539   CO2 27 09/21/2016 0539   GLUCOSE 89 09/21/2016 0539   BUN 6 09/21/2016 0539   CREATININE 0.66 09/21/2016 0539   CREATININE 0.66 02/27/2013 1501   CALCIUM 8.5 (L) 09/21/2016 0539   PROT 7.1 01/20/2020 1202   ALBUMIN 4.1 09/07/2016 1510   AST 38 09/07/2016 1510   ALT 34 09/07/2016 1510   ALKPHOS 58 09/07/2016 1510   BILITOT 0.5 09/07/2016 1510   GFRNONAA >60 09/21/2016 0539   GFRAA >60 09/21/2016 0539   Lab Results  Component Value Date   CHOL 188 02/27/2013   HDL 75 02/27/2013   LDLCALC 100 (H) 02/27/2013   TRIG 66 02/27/2013   CHOLHDL 2.5 02/27/2013   Lab Results  Component Value Date   HGBA1C 5.1 07/17/2012    Lab Results  Component Value Date   TSH 1.897 02/27/2013      ASSESSMENT AND PLAN 57 y.o. year old female  has a past medical history of Anemia, Anxiety, Bell's palsy (2000), Carpal tunnel syndrome, Carpal tunnel syndrome, Chest discomfort, Depression, Fasciculation, GERD (gastroesophageal reflux disease), Low ferritin level, Palpitations, Paresthesias, Vertigo, and White coat syndrome with diagnosis of hypertension. here with:  Paresthesias  - Physical exam is unremarkable - Discussed checking  CK level and magnesium d/t muscle cramps but has an appointment with PCP later this month she will ask them to check - Discussed potential NCV/EMG testing of the lower extremities. Patient wants to hold off for now d/t out of pocket cost.  - FU as needed   Butch Penny, MSN, NP-C 11/06/2022, 11:28 AM [facility] [address] [place], [place] [address] [phone]

## 2022-11-13 ENCOUNTER — Encounter: Payer: Self-pay | Admitting: Adult Health

## 2023-05-17 ENCOUNTER — Encounter (INDEPENDENT_AMBULATORY_CARE_PROVIDER_SITE_OTHER): Payer: Self-pay | Admitting: Otolaryngology

## 2023-06-07 ENCOUNTER — Encounter (INDEPENDENT_AMBULATORY_CARE_PROVIDER_SITE_OTHER): Payer: Self-pay

## 2023-06-07 ENCOUNTER — Ambulatory Visit (INDEPENDENT_AMBULATORY_CARE_PROVIDER_SITE_OTHER): Payer: No Typology Code available for payment source | Admitting: Otolaryngology

## 2023-06-07 VITALS — Ht 65.0 in | Wt 119.0 lb

## 2023-06-07 DIAGNOSIS — R0982 Postnasal drip: Secondary | ICD-10-CM

## 2023-06-07 DIAGNOSIS — H938X3 Other specified disorders of ear, bilateral: Secondary | ICD-10-CM

## 2023-06-07 DIAGNOSIS — H6993 Unspecified Eustachian tube disorder, bilateral: Secondary | ICD-10-CM

## 2023-06-07 NOTE — Progress Notes (Signed)
Dear Dr. Paulino Rily, Here is my assessment for our mutual patient, Suzanne Marks. Thank you for allowing me the opportunity to care for your patient. Please do not hesitate to contact me should you have any other questions. Sincerely, Dr. Jovita Kussmaul  Otolaryngology Clinic Note Referring provider: Dr. Paulino Rily HPI:  Suzanne Marks is a 57 y.o. female kindly referred by Dr. Paulino Rily for evaluation of bilateral ear fullness and discomfort  Patient reports: she got COVID in August (symptomatic), and then had bilateral ear fullness. She had swishing sound in the ear and had feels like there "cotton in the ear." Some vague discomfort/dull ache once in a while. Did flonase x2 weeks, tried sudafed. Did not help significantly. Tried flonase in October. Things have gotten better - significantly better = after she had an airplane ride. Still just a little full. Can pop her ear. Some crackling. Hearing is good, no tinnitus. Did do oral steroids (for back), but did not help.  Patient denies: ear pain, vertigo, drainage. Patient additionally denies: deep pain in ear canal, sensitivity to pressure changes Patient also denies barotrauma, vestibular suppressant use, ototoxic medication use Prior ear surgery: no  Ears not typically a problem Some post nasal drip, no cardinal symptoms of sinus infection otherwise (pressure/pain), sense of smell is ok  H&N Surgery: no (besides gum surgery) Personal or FHx of bleeding dz or anesthesia difficulty: no  AP/AC: no  Tobacco: no. 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[employer], Passenger transport manager)  Independent Review of Additional Tests or Records:  Referral notes reviewed:  No CT No audio  PMH/Meds/All/SocHx/FamHx/ROS:   Past Medical History:  Diagnosis Date   Anemia    Anxiety    Bell's palsy 2000   Carpal tunnel syndrome    both wrists   Carpal tunnel syndrome    Chest discomfort    Depression    Fasciculation    GERD (gastroesophageal reflux disease)    Low  ferritin level    no iron taken   Palpitations    Paresthesias    Vertigo    White coat syndrome with diagnosis of hypertension      Past Surgical History:  Procedure Laterality Date   BLADDER REPAIR  09/20/2016   Procedure: BLADDER REPAIR;  Surgeon: Myna Hidalgo, DO;  Location: [facility];  Service: Gynecology;;   BRAVO PH STUDY N/A 01/21/2014   Procedure: BRAVO PH STUDY;  Surgeon: Willis Modena, MD;  [facility];  Service: Endoscopy;  Laterality: N/A;   ESOPHAGOGASTRODUODENOSCOPY (EGD) WITH PROPOFOL N/A 01/21/2014   Procedure: ESOPHAGOGASTRODUODENOSCOPY (EGD) WITH PROPOFOL;  Surgeon: Willis Modena, MD;  [facility];  Service: Endoscopy;  Laterality: N/A;   EXTERNAL EAR SURGERY     age 16   EYE SURGERY     FOOT SURGERY  2012   left   GUM SURGERY  2011   LAPAROSCOPIC VAGINAL HYSTERECTOMY WITH SALPINGECTOMY Bilateral 09/20/2016   Procedure: LAPAROSCOPIC ASSISTED VAGINAL HYSTERECTOMY WITH SALPINGECTOMY;  Surgeon: Myna Hidalgo, DO;  Location: [facility];  Service: Gynecology;  Laterality: Bilateral;   NOVASURE ABLATION  03/2006   REFRACTIVE SURGERY Bilateral 05/2011   lasik   WISDOM TOOTH EXTRACTION  1990    Family History  Problem Relation Age of Onset   Hypertension Father    Cancer Mother        Glioblastoma   Seizures Mother    Heart Problems Maternal Grandmother    Parkinsonism Maternal Grandmother    Depression Maternal Grandmother    Heart Problems Maternal Grandfather  Heart Problems Paternal Grandmother    Heart Problems Paternal Grandfather      Social Connections: Not on file      Current Outpatient Medications:    ALOE VERA EX, Apply topically in the morning and at bedtime., Disp: , Rfl:    ALPRAZolam (XANAX) 0.25 MG tablet, Take 0.25 mg by mouth 2 (two) times daily as needed for anxiety., Disp: , Rfl:    Ascorbic Acid (VITAMIN C PO), Take by mouth., Disp: , Rfl:    b complex vitamins tablet, Take 1 tablet by mouth. Takes 1 M,T,Th,F,  Disp: , Rfl:    co-enzyme Q-10 30 MG capsule, Take 30 mg by mouth daily., Disp: , Rfl:    Cyanocobalamin (B-12 PO), Take 1 mg by mouth daily., Disp: , Rfl:    Digestive Enzyme CAPS, Take 1 capsule by mouth 3 (three) times daily with meals as needed. , Disp: , Rfl:    ergocalciferol (VITAMIN D2) 1.25 MG (50000 UT) capsule, Take by mouth once a week. 5,000 units, Disp: , Rfl:    estradiol (VIVELLE-DOT) 0.025 MG/24HR, Place 1 patch onto the skin 2 (two) times a week., Disp: , Rfl:    Iron-FA-B Cmp-C-Biot-Probiotic (FUSION PLUS PO), Take 1 capsule by mouth 2 (two) times a week., Disp: , Rfl:    MAGNESIUM PO, Take 300 mg by mouth daily., Disp: , Rfl:    Menaquinone-7 (K2 PO), Take by mouth., Disp: , Rfl:    Nutritional Supplements (DHEA PO), Take 5 mg by mouth., Disp: , Rfl:    OVER THE COUNTER MEDICATION, Turmeric Curcumin w/ blk pepper with breakfast., Disp: , Rfl:    OVER THE COUNTER MEDICATION, One Metagenics Ultra flora Blance Probiotic when wakes up, Disp: , Rfl:    UNABLE TO FIND, Med Name: algae based omega 3, 2 pills daily, Disp: , Rfl:    Multiple Vitamin (MULTIVITAMIN) capsule, Take 1 capsule by mouth daily. Probiotic, Disp: , Rfl:    Physical Exam:   Ht 5\' 5"  (1.651 m)   Wt 119 lb (54 kg)   LMP 06/28/2016 Comment: continous bleeding  BMI 19.80 kg/m   Salient findings:  CN II-XII intact  Bilateral EAC clear and TM intact with well pneumatized middle ear spaces; able to insufflate ears easily on both sides Weber 512: midline Rinne 512: AC > BC b/l  Rine 1024: AC > BC b/l  Anterior rhinoscopy: Septum relatively midline; no obvoius masses noted No lesions of oral cavity/oropharynx; dentition fair No obviously palpable neck masses/lymphadenopathy/thyromegaly No respiratory distress or stridor  Seprately Identifiable Procedures:  None  Impression & Plans:  [name] is a 57 y.o. female with:  1. Dysfunction of both eustachian tubes   2. Sensation of fullness in both ears    3. Post-nasal drip    Occurred after COVID in August. Improved with flonase, and now significantly improved. There is no effusion on exam today, she is able to autoinsufflate ears well and exam otherwise reassuring. We discussed audiogram, but she wished to defer. Discussed management which includes: Observation 2. Nasal sprays 3. BTT Given significant improvement, opted for #1. Will continue flonase (re-start) for 4 weeks and reflux gourmet (for PND), and see how she does. I think BTT is aggressive given improvement and she does not have significant otologic hx  We discussed f/u, but she would like to f/u as needed. Certainly if things do not improve or worsen   Thank you for allowing me the opportunity to care for your patient.  Please do not hesitate to contact me should you have any other questions.  Sincerely, Jovita Kussmaul, MD Otolarynoglogist (ENT), [facility] Phone: [phone] Fax: [phone]  06/07/2023, 7:17 PM

## 2023-06-07 NOTE — Patient Instructions (Signed)
Reflux Gourmet - amazon Flonase sensimist

## 2023-07-23 ENCOUNTER — Other Ambulatory Visit: Payer: Self-pay | Admitting: Family Medicine

## 2023-07-23 DIAGNOSIS — Z1231 Encounter for screening mammogram for malignant neoplasm of breast: Secondary | ICD-10-CM

## 2023-08-16 ENCOUNTER — Encounter: Payer: Self-pay | Admitting: Neurology

## 2023-09-17 ENCOUNTER — Ambulatory Visit
Admission: RE | Admit: 2023-09-17 | Discharge: 2023-09-17 | Disposition: A | Discharge status: Home / Self Care | Payer: No Typology Code available for payment source | Source: Ambulatory Visit | Attending: Family Medicine

## 2023-09-17 DIAGNOSIS — Z1231 Encounter for screening mammogram for malignant neoplasm of breast: Secondary | ICD-10-CM

## 2023-09-21 NOTE — Progress Notes (Signed)
 Initial neurology clinic note  Reason for Evaluation: Consultation requested by Hurman Horn, MD for an opinion regarding pain and paresthesias. My final recommendations will be communicated back to the requesting physician by way of shared medical record or letter to requesting physician via [place] mail.  HPI: This is Ms. Suzanne Marks, a 58 y.o. right-handed female with a medical history of anxiety, vit D deficiency, iron deficiency anemia, GERD who presents to neurology clinic with the chief complaint of paresthesias. The patient is alone today.  Patient's mom passed away from GBM in 10-22-12. Patient started having muscle twitching that came and went at that time. In 08/2022, patient had numbness on the top of her feet. She was also having numbness in her hands and elbows. She had COVID in 02/2023. About 3 weeks after, she had back pain. She took ibuprofen which helped. She then started having back spasms that were affecting sleep. PCP gave muscle relaxer that did not help. Steroid dose pack helped. She was then on vacation in 04/2023 and walking a lot and had a lot of spine tightness, tingling and tightness of calves. She saw sports medicine. She then tried PT. This did not help much. She had MRIs that did not show abnormalities. She has seen Dr. Berline Chough and had some manipulation of spine. This is helping a little.   She continues to have a cool sensation on the outside of her lower legs, top of feet, and top of hands. Her buttocks can go numb when sitting too long. Her symptoms do not cause her to change her activities. She is very active.  She also mentions that she had some eye twitching. She saw her eye doctor who felt it was stress related.  Patient has been seen at [facility] in the past, first on 02/05/2013 by Dr. Marjory Lies for muscle twitching. EMG on 06/04/2013 of RLE with NCS in all extremities showed mild bilateral CTS with no evidence of PN. She was seen more recently for Dr. Ethelene Browns for burning  paresthesias on 11/06/22. EMG of lower extremities was discussed but patient did not have this.  She takes a lot of vitamins (B complex, B12, vit D, vit K, vit C, magnesium, probiotic). She has a plant based diet.   The patient has not noticed any recent skin rashes nor does she report any constitutional symptoms like fever, night sweats, anorexia or unintentional weight loss.  EtOH use: weekends - no more than 2 drinks a night  Restrictive diet? Plant based Family history of neuropathy/myopathy/neurologic disease? Maternal grandmother with PD   MEDICATIONS:  Outpatient Encounter Medications as of 10/03/2023  Medication Sig Note   Ascorbic Acid (VITAMIN C PO) Take by mouth.    b complex vitamins tablet Take 1 tablet by mouth. Takes 1 M,T,Th,F 03/20/2022: Takes M, W Th Fr   co-enzyme Q-10 30 MG capsule Take 30 mg by mouth daily.    Cyanocobalamin (B-12 PO) Take 1 mg by mouth daily. 5 days a week 10/03/23    Digestive Enzyme CAPS Take 1 capsule by mouth 3 (three) times daily with meals as needed.     ergocalciferol (VITAMIN D2) 1.25 MG (50000 UT) capsule Take by mouth once a week. 5,000 units 03/20/2022: 03/20/22 5 x week   estradiol (VIVELLE-DOT) 0.025 MG/24HR Place 1 patch onto the skin 2 (two) times a week.    estradiol (VIVELLE-DOT) 0.025 MG/24HR Place 1 patch onto the skin 2 (two) times a week.    Iron-FA-B Cmp-C-Biot-Probiotic (FUSION PLUS  PO) Take 1 capsule by mouth 2 (two) times a week.    MAGNESIUM PO Take 300 mg by mouth daily.    Menaquinone-7 (K2 PO) Take by mouth. 03/20/2022: 150 mcg   Nutritional Supplements (DHEA PO) Take 5 mg by mouth.    OVER THE COUNTER MEDICATION One Metagenics Ultra flora Blance Probiotic when wakes up    UNABLE TO FIND Med Name: algae based omega 3, 2 pills daily    ALOE VERA EX Apply topically in the morning and at bedtime.    ALPRAZolam (XANAX) 0.25 MG tablet Take 0.25 mg by mouth 2 (two) times daily as needed for anxiety. (Patient not taking: Reported on  10/03/2023)    Multiple Vitamin (MULTIVITAMIN) capsule Take 1 capsule by mouth daily. Probiotic    OVER THE COUNTER MEDICATION Turmeric Curcumin w/ blk pepper with breakfast. (Patient not taking: Reported on 10/03/2023)    No facility-administered encounter medications on file as of 10/03/2023.    PAST MEDICAL HISTORY: Past Medical History:  Diagnosis Date   Anemia    Anxiety    Bell's palsy 2000   Carpal tunnel syndrome    both wrists   Carpal tunnel syndrome    Chest discomfort    Depression    Fasciculation    GERD (gastroesophageal reflux disease)    Low ferritin level    no iron taken   Palpitations    Paresthesias    Vertigo    White coat syndrome with diagnosis of hypertension     PAST SURGICAL HISTORY: Past Surgical History:  Procedure Laterality Date   BLADDER REPAIR  09/20/2016   Procedure: BLADDER REPAIR;  Surgeon: Myna Hidalgo, DO;  Location: [facility];  Service: Gynecology;;   [facility] STUDY N/A 01/21/2014   Procedure: BRAVO PH STUDY;  Surgeon: Willis Modena, MD;  Location: [facility];  Service: Endoscopy;  Laterality: N/A;   ESOPHAGOGASTRODUODENOSCOPY (EGD) WITH PROPOFOL N/A 01/21/2014   Procedure: ESOPHAGOGASTRODUODENOSCOPY (EGD) WITH PROPOFOL;  Surgeon: Willis Modena, MD;  Location: [facility];  Service: Endoscopy;  Laterality: N/A;   EXTERNAL EAR SURGERY     age 54   EYE SURGERY     FOOT SURGERY  2012   left   GUM SURGERY  2011   LAPAROSCOPIC VAGINAL HYSTERECTOMY WITH SALPINGECTOMY Bilateral 09/20/2016   Procedure: LAPAROSCOPIC ASSISTED VAGINAL HYSTERECTOMY WITH SALPINGECTOMY;  Surgeon: Myna Hidalgo, DO;  Location: [facility];  Service: Gynecology;  Laterality: Bilateral;   NOVASURE ABLATION  03/2006   REFRACTIVE SURGERY Bilateral 05/2011   lasik   WISDOM TOOTH EXTRACTION  1990    ALLERGIES: Allergies  Allergen Reactions   Lanolin     Topical causes rash    FAMILY HISTORY: Family History  Problem Relation Age of Onset   Hypertension Father     Cancer Mother        Glioblastoma   Seizures Mother    Heart Problems Maternal Grandmother    Parkinsonism Maternal Grandmother    Depression Maternal Grandmother    Heart Problems Maternal Grandfather    Heart Problems Paternal Grandmother    Heart Problems Paternal Grandfather     SOCIAL HISTORY: Social History   Tobacco Use   Smoking status: Never   Smokeless tobacco: Current  Vaping Use   Vaping status: Never Used  Substance Use Topics   Alcohol use: Yes    Comment: Rare- 2 on the weekend   Drug use: No   Social History   Social History Narrative   Patient lives at  home with her family.  Works at [employer].  2 Children.     Caffeine Use: occasionally (iced tea)   Are you right handed or left handed? Right   Are you currently employed ?    What is your current occupation? [employer]    Do you live at home alone?    Who lives with you? Husband    What type of home do you live in: 1 story or 2 story? two    Caffiene 1/2 cup a day     OBJECTIVE: PHYSICAL EXAM: BP (!) 143/93 (Cuff Size: Normal)   Pulse 78   Ht 5\' 5"  (1.651 m)   Wt 119 lb (54 kg)   LMP 06/28/2016 Comment: continous bleeding  SpO2 100%   BMI 19.80 kg/m   General: General appearance: Awake and alert. No distress. Cooperative with exam.  Skin: No obvious rash or jaundice. HEENT: Atraumatic. Anicteric. Lungs: Non-labored breathing on room air  Heart: Regular. No carotid bruits. Extremities: No edema. No obvious deformity.  Musculoskeletal: No obvious joint swelling. Psych: Affect appropriate.  Neurological: Mental Status: Alert. Speech fluent. No pseudobulbar affect Cranial Nerves: CNII: No RAPD. Visual fields grossly intact. CNIII, IV, VI: PERRL. No nystagmus. EOMI. CN V: Facial sensation intact bilaterally to fine touch. CN VII: Facial muscles symmetric and strong. No ptosis at rest. CN VIII: Hearing grossly intact bilaterally. CN IX: No hypophonia. CN X: Palate elevates  symmetrically. CN XI: Full strength shoulder shrug bilaterally. CN XII: Tongue protrusion full and midline. No atrophy or fasciculations. No significant dysarthria Motor: Tone is normal. Strength is 5/5 in bilateral upper and lower extremities. Reflexes:  Right Left   Bicep 2+ 2+   Tricep 2+ 2+   BrRad 2+ 2+   Knee 2+ 2+   Ankle 2+ 2+    Pathological Reflexes: Babinski: flexor response bilaterally Hoffman: absent bilaterally Troemner: absent bilaterally Sensation: Pinprick: Intact in all extremities Vibration: Intact in all extremities Proprioception: Intact in bilateral great toes. Coordination: Intact finger-to- nose-finger bilaterally. Romberg negative. Gait: Able to rise from chair with arms crossed unassisted. Normal, narrow-based gait.  Lab and Test Review: Internal labs: B6 (01/20/20): elevated to 137.3 B6 (01/30/20): 23.3  01/20/20: ANCA: p and c anca negative B1 wnl CRP wnl ESR wnl SSA, SSB wnl ANA negative SPEP w/ IFE: no M protein  Imaging/Procedures: MRI lumbar spine wo contrast (01/18/21): FINDINGS: Segmentation: For the purposes of this dictation, five lumbar vertebrae are assumed and the caudal most well-formed intervertebral disc is designated L5-S1.   Alignment: Mild lumbar levocurvature. No significant spondylolisthesis.   Vertebrae: The vertebral body height is maintained. No significant marrow edema or focal suspicious osseous lesion. L5 vertebral body hemangioma.   Conus medullaris and cauda equina: Conus extends to the L1 level. No signal abnormality within the visualized distal spinal cord.   Paraspinal and other soft tissues: Right extrarenal pelvis. Paraspinal soft tissues within normal limits.   Disc levels:   Mild multilevel disc degeneration greatest at L2-L3 and L3-L4.   T11-T12: Imaged sagittally. No significant disc herniation or stenosis.   T12-L1: No significant disc herniation or stenosis.   L1-L2: No significant disc  herniation or stenosis.   L2-L3: Disc bulge. Superimposed left subarticular/foraminal disc protrusion. Minimal relative left subarticular narrowing without nerve root impingement. Central canal patent. Mild relative left inferior neural foraminal narrowing.   L3-L4: Disc bulge. No significant spinal canal stenosis. Mild relative bilateral inferior neural foraminal narrowing   L4-L5: Disc bulge. Mild facet  arthrosis/ligamentum flavum hypertrophy. Minimal relative bilateral subarticular narrowing without nerve root impingement. Central canal patent. Mild bilateral neural foraminal narrowing.   L5-S1: Mild facet arthrosis. No significant disc herniation or stenosis.   IMPRESSION: Lumbar spondylosis, as outlined. No significant central canal stenosis. Sites of mild subarticular narrowing (without appreciable nerve root impingement), as described. Mild relative neural foraminal narrowing on the left at L2-L3, bilaterally at L3-L4 and bilaterally at L4-L5 (without appreciable nerve root impingement at these sites).   Mild lumbar levocurvature.  MRI brain w/wo contrast (02/01/2020): FINDINGS:  No abnormal lesions are seen on diffusion-weighted views to suggest acute ischemia. The cortical sulci, fissures and cisterns are normal in size and appearance. Lateral, third and fourth ventricle are normal in size and appearance. No extra-axial fluid collections are seen. No evidence of mass effect or midline shift.  No abnormal lesions are seen on post contrast views.   On sagittal views the posterior fossa, pituitary gland and corpus callosum are unremarkable. No evidence of intracranial hemorrhage on SWI views. The orbits and their contents, paranasal sinuses and calvarium are unremarkable.  Intracranial flow voids are present.     IMPRESSION:  Normal MRI brain (with and without).   EMG (06/04/2013 - by Dr. Marjory Lies): FINDINGS: NERVE CONDUCTION STUDY: Bilateral median and right ulnar motor  responses have normal distal latencies, amplitudes, conduction velocities and F-wave latencies. Bilateral peroneal, tibial motor responses have normal distal latencies, amplitudes, conduction velocities and F-wave latencies. Bilateral H reflex responses are normal. Bilateral median sensory responses have borderline prolonged latencies and normal amplitudes. Right ulnar, bilateral peroneal sensory responses are normal.   NEEDLE ELECTROMYOGRAPHY: Needle examination of selected muscles of right lower extremity (vastus medialis, tibialis anterior, gastrocnemius) shows no abnormal spontaneous occurred at rest and normal motor unit recruitment on exertion.   IMPRESSION:  Mild abnormal study demonstrating: 1. 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 2. No evidence of diffuse large fiber neuropathy or myopathy at this time 3. Compared to prior study from 09/24/12, right median motor response distal latency has improved otherwise no significant change.  External imaging: MRI cervical spine wo contrast (03/07/22): FINDINGS:  Alignment: No substantial subluxation.   Vertebrae:  No marrow signal abnormalities to suggest neoplasm. Incidental C5 vertebral venous malformation.   Spinal cord:  Normal signal.   Craniocervical junction:  No focal abnormality.   Degenerative changes:   C1-C2:  No substantial canal or foraminal stenosis.  C2-C3:  No substantial canal or foraminal stenosis.  C3-C4:  Mild facet hypertrophy and uncovertebral spurring without substantial canal or foraminal stenosis.  C4-C5:  Mild facet hypertrophy and uncovertebral spurring without substantial canal or foraminal stenosis.  C5-C6:  Mild facet hypertrophy and uncovertebral spurring without substantial canal or foraminal stenosis.  C6-C7:  No substantial canal or foraminal stenosis.  C7-T1:  No substantial canal or foraminal stenosis.   Visualized portion of  the thoracic spine:  No high grade canal stenosis.   CONCLUSION:  Mild multilevel degenerative changes of the cervical spine without substantial canal or foraminal stenosis. No cervical cord signal abnormality.   MRI lumbar spine w/wo contrast (03/24/22): FINDINGS:   Alignment:  No substantial subluxation.   Vertebrae:  Vertebral body heights are maintained. No marrow signal abnormalities to suggest neoplasm. No enhancement.   Conus medullaris:  In normal position, terminating at L1. Normal signal and contour. No enhancement.   Degenerative changes:   T12-L1:  No substantial canal or foraminal stenosis.  L1-L2:  No substantial canal or foraminal stenosis. Facet hypertrophy and ligamentum flavum thickening.  L2-L3:  No substantial canal or foraminal stenosis. Facet hypertrophy and ligamentum flavum thickening.  L3-L4:  Facet hypertrophy and ligamentum flavum thickening with resulting mild neural foraminal narrowing. No substantial canal stenosis.  L4-L5: Facet hypertrophy and ligamentum flavum thickening with resulting mild neural foraminal narrowing. No substantial canal stenosis.  L5-S1:  No substantial canal or foraminal stenosis. Facet hypertrophy.   Upper Sacrum: Please see separately dictated sacral MRI.    IMPRESSION: No acute findings are identified within the lumbar spine.   Mild degenerative neural foraminal narrowing bilaterally at L3-L4 and L4-L5. No substantial canal stenosis.   MRI brain w/wo contrast (04/21/22): FINDINGS:  Calvarium/skull base: No focal marrow replacing lesion.   Orbits: No mass lesion.   Paranasal sinuses: No air-fluid levels.   Brain: No restricted diffusion. No mass effect. No evidence of acute hemorrhage. No hydrocephalus. No significant white matter disease for the patient's age. No abnormal enhancement. Normal appearance of the major intracranial arteries and dural venous sinuses.   Additional comments: None.   CONCLUSION:  1. No acute  intracranial abnormality.   2.  No radiographic findings to explain reported paresthesias.   MRI thoracic spine w/wo contrast (08/01/23): FINDINGS:  Thoracic spinal cord: Normal signal and contour. Conus medullaris terminates in normal position.   Vertebrae: Vertebral body heights are maintained. No marrow signal abnormality to suggest neoplasm. Several small vertebral venous malformations.   Degenerative changes: Facet hypertrophy contributes to mild canal stenosis at T10-T11. No substantial canal stenosis at the remaining thoracic levels. Mild foraminal stenosis on the right at T4-T5. No evidence for high-grade foraminal stenosis.   Alignment: No substantial subluxation.   Paraspinal soft tissues: No evidence of edema or focal mass lesion.   IMPRESSION:  1. No acute findings involving the thoracic spine.  2.  Mild canal stenosis at T10-T11 without substantial canal stenosis at the remaining thoracic levels.  3.  No high-grade thoracic foraminal stenosis.  4.  No thoracic cord signal abnormality.   ASSESSMENT: CHIEKO NEISES is a 57 y.o. female who presents for evaluation of paresthesias of bilateral upper and lower limbs. She has a relevant medical history of anxiety, vit D deficiency, iron deficiency anemia, GERD. Her neurological examination is essentially normal today. Available diagnostic data is significant for elevated B6 in 2021 and essentially normal MRI neuroaxis. The etiology of patient's symptoms is unclear. The distribution of symptoms does not fit with a distal symmetric neuropathy well nor radiculopathy nor individual nerve distribution. She is on a lot of vitamins and plant based diet, so a vitamin deficiency or toxicity is possible. I will send labs to look for treatable causes. Could consider EMG and skin biopsy if patient decides to pursue.  PLAN: -Blood work: B1, B6, B12, MMA, IFE, glucose - fasting -Discussed EMG, patient will think about  -Return to clinic to be  determined  The impression above as well as the plan as outlined below were extensively discussed with the patient who voiced understanding. All questions were answered to their satisfaction.  When available, results of the above investigations and possible further recommendations will be communicated to the patient via telephone/MyChart. Patient to call office if not contacted after expected testing turnaround time.   Total time spent reviewing records, interview, history/exam, documentation, and coordination of care on day of encounter:  55 min   Thank you for allowing me to participate in patient's care.  If I can answer  any additional questions, I would be pleased to do so.  Jacquelyne Balint, MD   CC: Mila Palmer, MD [address] [place] [address]  CC: Referring provider: Hurman Horn, MD [phone] [address] [place],  [place] [address]

## 2023-10-03 ENCOUNTER — Encounter: Payer: Self-pay | Admitting: Neurology

## 2023-10-03 ENCOUNTER — Ambulatory Visit (INDEPENDENT_AMBULATORY_CARE_PROVIDER_SITE_OTHER): Payer: No Typology Code available for payment source | Admitting: Neurology

## 2023-10-03 VITALS — BP 143/93 | HR 78 | Ht 65.0 in | Wt 119.0 lb

## 2023-10-03 DIAGNOSIS — E569 Vitamin deficiency, unspecified: Secondary | ICD-10-CM | POA: Diagnosis not present

## 2023-10-03 DIAGNOSIS — M546 Pain in thoracic spine: Secondary | ICD-10-CM | POA: Diagnosis not present

## 2023-10-03 DIAGNOSIS — R209 Unspecified disturbances of skin sensation: Secondary | ICD-10-CM

## 2023-10-03 NOTE — Patient Instructions (Addendum)
 I saw you today for abnormal sensations in legs and arms. I am not sure the cause of your symptoms. I would like to start with lab work due to the many vitamins you are taking and make sure there is no reversible or treatable cause of your symptoms. We also discussed EMG, but will hold off on this until we have your lab results.  I will be in touch when I have your results. Please let me know if you have any questions or concerns in the meantime.   Recommend the following measures that may provide some symptomatic benefit for muscle twitching and/or cramps: - Adequate oral clear fluid intake to maintain optimal hydration (about 2.5 liters, or around 8-10 glasses per day) Avoidance of caffeine Trial of DIET tonic water: About 1 glass, up to 6 times daily Magnesium oxide up to 400 mg by mouth twice daily, as needed (over the counter) Gentle muscle stretching routine, especially before bedtime  The physicians and staff at [facility] are committed to providing excellent care. You may receive a survey requesting feedback about your experience at our office. We strive to receive "very good" responses to the survey questions. If you feel that your experience would prevent you from giving the office a "very good " response, please contact our office to try to remedy the situation. We may be reached at [phone]. Thank you for taking the time out of your busy day to complete the survey.  Jacquelyne Balint, MD [facility]  ELECTROMYOGRAM AND NERVE CONDUCTION STUDIES (EMG/NCS) INSTRUCTIONS  How to Prepare The neurologist conducting the EMG will need to know if you have certain medical conditions. Tell the neurologist and other EMG lab personnel if you: Have a pacemaker or any other electrical medical device Take blood-thinning medications Have hemophilia, a blood-clotting disorder that causes prolonged bleeding Bathing Take a shower or bath shortly before your exam in order to remove oils from  your skin. Don't apply lotions or creams before the exam.  What to Expect You'll likely be asked to change into a hospital gown for the procedure and lie down on an examination table. The following explanations can help you understand what will happen during the exam.  Electrodes. The neurologist or a technician places surface electrodes at various locations on your skin depending on where you're experiencing symptoms. Or the neurologist may insert needle electrodes at different sites depending on your symptoms.  Sensations. The electrodes will at times transmit a tiny electrical current that you may feel as a twinge or spasm. The needle electrode may cause discomfort or pain that usually ends shortly after the needle is removed. If you are concerned about discomfort or pain, you may want to talk to the neurologist about taking a short break during the exam.  Instructions. During the needle EMG, the neurologist will assess whether there is any spontaneous electrical activity when the muscle is at rest - activity that isn't present in healthy muscle tissue - and the degree of activity when you slightly contract the muscle.  He or she will give you instructions on resting and contracting a muscle at appropriate times. Depending on what muscles and nerves the neurologist is examining, he or she may ask you to change positions during the exam.  After your EMG You may experience some temporary, minor bruising where the needle electrode was inserted into your muscle. This bruising should fade within several days. If it persists, contact your primary care doctor.

## 2023-10-05 ENCOUNTER — Other Ambulatory Visit

## 2023-10-10 ENCOUNTER — Telehealth: Payer: Self-pay | Admitting: Neurology

## 2023-10-10 LAB — VITAMIN B12: Vitamin B-12: 573 pg/mL (ref 200–1100)

## 2023-10-10 LAB — METHYLMALONIC ACID, SERUM: Methylmalonic Acid, Quant: 157 nmol/L (ref 55–335)

## 2023-10-10 LAB — VITAMIN B1: Vitamin B1 (Thiamine): 27 nmol/L (ref 8–30)

## 2023-10-10 LAB — VITAMIN B6: Vitamin B6: 82.1 ng/mL — ABNORMAL HIGH (ref 2.1–21.7)

## 2023-10-10 LAB — IMMUNOFIXATION ELECTROPHORESIS
IgM, Serum: 133 mg/dL (ref 50–300)
IgM, Serum: 965 mg/dL (ref 600–300)
Immunoglobulin A: 248 mg/dL (ref 47–310)
Immunoglobulin A: 965 mg/dL (ref 600–310)

## 2023-10-10 LAB — GLUCOSE, FASTING: Glucose, Bld: 96 mg/dL (ref 65–99)

## 2023-10-10 NOTE — Telephone Encounter (Signed)
 Called patient regarding lab results. Her fasting B6 was elevated to 82.1, likely 2/2 her taking B complex for many years. I explained that B6 toxicity can cause sensory problems and in the worst case, sensory neuronopathy (which she does not currently have evidence of on examination).  I recommended she stop her B complex. She agreed. She would like to see how she does with stopping the B complex and will call back if symptoms do not improve in the coming months (may take up to 6 months to see full improvement).  All questions were answered.  Jacquelyne Balint, MD [facility]

## 2023-11-05 ENCOUNTER — Encounter: Payer: Self-pay | Admitting: Cardiology

## 2023-11-05 ENCOUNTER — Ambulatory Visit: Payer: No Typology Code available for payment source | Attending: Cardiology | Admitting: Cardiology

## 2023-11-05 VITALS — BP 158/84 | HR 62 | Ht 65.0 in | Wt 121.4 lb

## 2023-11-05 DIAGNOSIS — I471 Supraventricular tachycardia, unspecified: Secondary | ICD-10-CM | POA: Diagnosis not present

## 2023-11-05 DIAGNOSIS — I493 Ventricular premature depolarization: Secondary | ICD-10-CM

## 2023-11-05 NOTE — Patient Instructions (Signed)
 Medication Instructions:  Your physician recommends that you continue on your current medications as directed. Please refer to the Current Medication list given to you today.  *If you need a refill on your cardiac medications before your next appointment, please call your pharmacy*  Lab Work: None ordered.  If you have labs (blood work) drawn today and your tests are completely normal, you will receive your results only by: MyChart Message (if you have MyChart) OR A paper copy in the mail If you have any lab test that is abnormal or we need to change your treatment, we will call you to review the results.  Testing/Procedures: None ordered.   Follow-Up: At [facility], you and your health needs are our priority.  As part of our continuing mission to provide you with exceptional heart care, our providers are all part of one team.  This team includes your primary Cardiologist (physician) and Advanced Practice Providers or APPs (Physician Assistants and Nurse Practitioners) who all work together to provide you with the care you need, when you need it.  Your next appointment:   12 months with Dr Lawana Pray PA  We recommend signing up for the patient portal called "MyChart".  Sign up information is provided on this After Visit Summary.  MyChart is used to connect with patients for Virtual Visits (Telemedicine).  Patients are able to view lab/test results, encounter notes, upcoming appointments, etc.  Non-urgent messages can be sent to your provider as well.   To learn more about what you can do with MyChart, go to [email or web address].         1st Floor: - Lobby - Registration  - Pharmacy  - Lab - Cafe  2nd Floor: - PV Lab - Diagnostic Testing (echo, CT, nuclear med)  3rd Floor: - Vacant  4th Floor: - TCTS (cardiothoracic surgery) - AFib Clinic - Structural Heart Clinic - Vascular Surgery  - Vascular Ultrasound  5th Floor: - [facility] (general and  EP) - Clinical Pharmacy for coumadin, hypertension, lipid, weight-loss medications, and med management appointments    Valet parking services will be available as well.

## 2023-11-05 NOTE — Progress Notes (Signed)
  Electrophysiology Office Note:   Date:  11/05/2023  ID:  Suzanne Marks, DOB 1965-10-21, MRN [id number]  Primary Cardiologist: None Primary Heart Failure: None Electrophysiologist: Earl Losee Cortland Ding, MD      History of Present Illness:   Suzanne Marks is a 58 y.o. female with h/o SVT, PVCs seen today for routine electrophysiology followup.   Since last being seen in our clinic the patient reports doing well.  She continues to have short episodes of palpitations, but for the most part has been doing well.  She feels palpitations but does not have weakness, fatigue, shortness of breath.  She continues to do her daily activities.  She feels her palpitations are related to stress.  she denies chest pain, palpitations, dyspnea, PND, orthopnea, nausea, vomiting, dizziness, syncope, edema, weight gain, or early satiety.   Review of systems complete and found to be negative unless listed in HPI.   EP Information / Studies Reviewed:    EKG is ordered today. Personal review as below.  EKG Interpretation Date/Time:  Monday November 05 2023 16:10:56 EDT Ventricular Rate:  62 PR Interval:  128 QRS Duration:  100 QT Interval:  404 QTC Calculation: 410 R Axis:   79  Text Interpretation: Normal sinus rhythm Incomplete right bundle branch block No previous ECGs available Confirmed by Benoit Meech ([id number]) on 11/05/2023 4:12:17 PM     Risk Assessment/Calculations:             Physical Exam:   VS:  BP (!) 158/84 (BP Location: Left Arm, Patient Position: Sitting, Cuff Size: Normal)   Pulse 62   Ht 5\' 5"  (1.651 m)   Wt 121 lb 6.4 oz (55.1 kg)   LMP 06/28/2016 Comment: continous bleeding  SpO2 98%   BMI 20.20 kg/m    Wt Readings from Last 3 Encounters:  11/05/23 121 lb 6.4 oz (55.1 kg)  10/03/23 119 lb (54 kg)  06/07/23 119 lb (54 kg)     GEN: Well nourished, well developed in no acute distress NECK: No JVD; No carotid bruits CARDIAC: Regular rate and rhythm, no murmurs, rubs,  gallops RESPIRATORY:  Clear to auscultation without rales, wheezing or rhonchi  ABDOMEN: Soft, non-tender, non-distended EXTREMITIES:  No edema; No deformity   ASSESSMENT AND PLAN:    1.  SVT: Overall feels well.  Has had minimal episodes of atrial fibrillation.  Happy with her control.  She Milley Vining get Kardia mobile for further monitoring.  No other changes.  2.  PVCs: Burden of less than 1%.  Minimally symptomatic.  No changes.  3.  Elevated blood pressure: No diagnosis of hypertension.  Blood pressure usually well-controlled.  No changes.  Follow up with EP APP in 12 months  Signed, Joni Colegrove Cortland Ding, MD

## 2024-07-09 ENCOUNTER — Other Ambulatory Visit: Payer: Self-pay | Admitting: Family Medicine

## 2024-07-09 DIAGNOSIS — Z1231 Encounter for screening mammogram for malignant neoplasm of breast: Secondary | ICD-10-CM

## 2024-09-17 ENCOUNTER — Ambulatory Visit
